# Patient Record
Sex: Female | Born: 1975 | Race: White | Hispanic: No | Marital: Married | Smoking: Never smoker
Health system: Southern US, Community
[De-identification: ages and names within clinical notes are randomized; demographics above are authoritative.]

## PROBLEM LIST (undated history)

## (undated) DIAGNOSIS — H8112 Benign paroxysmal vertigo, left ear: Secondary | ICD-10-CM

## (undated) HISTORY — PX: TONSILLECTOMY: SUR1361

## (undated) HISTORY — PX: TUBAL LIGATION: SHX77

## (undated) HISTORY — DX: Benign paroxysmal vertigo, left ear: H81.12

---

## 2015-02-18 LAB — HM MAMMOGRAPHY

## 2015-12-04 LAB — BASIC METABOLIC PANEL
CREATININE: 0.5 (ref 0.5–1.1)
Glucose: 84
POTASSIUM: 4.8 (ref 3.4–5.3)
Sodium: 139 (ref 137–147)

## 2015-12-04 LAB — CBC AND DIFFERENTIAL
HCT: 39 (ref 36–46)
Hemoglobin: 12.8 (ref 12.0–16.0)
WBC: 6

## 2015-12-04 LAB — TSH: TSH: 1.61 (ref 0.41–5.90)

## 2015-12-04 LAB — VITAMIN D 25 HYDROXY (VIT D DEFICIENCY, FRACTURES): Vit D, 25-Hydroxy: 35

## 2015-12-04 LAB — HM HIV SCREENING LAB: HM HIV Screening: NEGATIVE

## 2015-12-04 LAB — HEPATIC FUNCTION PANEL
ALT: 12 (ref 7–35)
AST: 15 (ref 13–35)

## 2015-12-04 LAB — VITAMIN B12: Vitamin B-12: 1500

## 2015-12-04 LAB — HEMOGLOBIN A1C: HEMOGLOBIN A1C: 5.7

## 2016-06-08 ENCOUNTER — Ambulatory Visit: Payer: Self-pay | Admitting: Physician Assistant

## 2016-06-14 ENCOUNTER — Ambulatory Visit: Payer: Self-pay | Admitting: Osteopathic Medicine

## 2016-06-19 DIAGNOSIS — Z0189 Encounter for other specified special examinations: Secondary | ICD-10-CM

## 2016-07-12 ENCOUNTER — Encounter: Payer: Self-pay | Admitting: Osteopathic Medicine

## 2016-07-12 ENCOUNTER — Ambulatory Visit (INDEPENDENT_AMBULATORY_CARE_PROVIDER_SITE_OTHER): Payer: BLUE CROSS/BLUE SHIELD | Admitting: Osteopathic Medicine

## 2016-07-12 VITALS — BP 135/78 | HR 74 | Ht 63.0 in | Wt 271.0 lb

## 2016-07-12 DIAGNOSIS — Z113 Encounter for screening for infections with a predominantly sexual mode of transmission: Secondary | ICD-10-CM

## 2016-07-12 DIAGNOSIS — M722 Plantar fascial fibromatosis: Secondary | ICD-10-CM | POA: Diagnosis not present

## 2016-07-12 DIAGNOSIS — E039 Hypothyroidism, unspecified: Secondary | ICD-10-CM | POA: Diagnosis not present

## 2016-07-12 DIAGNOSIS — Z8639 Personal history of other endocrine, nutritional and metabolic disease: Secondary | ICD-10-CM

## 2016-07-12 DIAGNOSIS — G8929 Other chronic pain: Secondary | ICD-10-CM

## 2016-07-12 DIAGNOSIS — M545 Low back pain, unspecified: Secondary | ICD-10-CM

## 2016-07-12 DIAGNOSIS — Z87898 Personal history of other specified conditions: Secondary | ICD-10-CM

## 2016-07-12 DIAGNOSIS — Z8742 Personal history of other diseases of the female genital tract: Secondary | ICD-10-CM

## 2016-07-12 MED ORDER — DICLOFENAC SODIUM 1 % TD GEL: 4.0000 g | Freq: Four times a day (QID) | TRANSDERMAL | 5 refills | Status: AC

## 2016-07-12 MED ORDER — MELOXICAM 7.5 MG PO TABS: 7.5000 mg | ORAL_TABLET | Freq: Every day | ORAL | 1 refills | Status: DC

## 2016-07-12 NOTE — Progress Notes (Signed)
HPI: Gabriella Williams is a 41 y.o. female  who [facility] today, 07/12/16,  for chief complaint of:  Chief Complaint  Patient presents with  . Establish Care    Feet - feels like walking on rocks, was told at one point might be plantar fasciits, no home exercises or OTC meds, sleeve on feet wasn't helpful but no other therapies/exercises were tried. Located at antero-medial calcaneus for the most part, occasionally a anterior calcaneus along plantar fascia insertion, bilaterally  Back pain - lower back, aching, sharp pains, stiffness. Tramadol been taking for a year and a half as well as the diclofenac. Previously getting these from family dr. no leg numbness/weakness, no sciatica/radiculopathy symptoms.  Hx thyroid problems: Patient is not sure which dose of levothyroxine she is taking, states it has been maybe over a year since labs were checked  Hx depression/anxiety: Fairly well controlled on current medications, she is following with psychiatry. She is also taking Vyvanse for eating disorder  Hx vitamin D and B12 deficiency: She is taking over-the-counter supplementation  Hx Irritable bowel syndrome - back and forth from constipation to diarrhea. Occasional flares but overall not too bothersome.  Hx abnormal Pap smear: Patient states has been a few years since last Pap which was normal but has had previously abnormal ones. Also would like testing for STDs, had some concerns about ex-boyfriend and possible exposure    Past medical history, surgical history, social history and family history reviewed.  Patient Active Problem List   Diagnosis Date Noted  . Hypothyroidism 07/13/2016  . History of abnormal cervical Pap smear 07/13/2016  . Routine screening for STI (sexually transmitted infection) 07/13/2016  . Chronic bilateral low back pain without sciatica 07/13/2016  . Plantar fasciitis, bilateral 07/13/2016  . History of vitamin D  deficiency 07/13/2016  . History of non anemic vitamin B12 deficiency 07/13/2016    Current medication list and allergy/intolerance information reviewed.   No current outpatient prescriptions on file prior to visit.   No current facility-administered medications on file prior to visit.    Allergies  Allergen Reactions  . Clindamycin/Lincomycin     CHEST PAIN  . Penicillins Hives    HIVES      Review of Systems:  Constitutional: No recent illness  HEENT: No  headache, no vision change  Cardiac: No  chest pain, No  pressure, No palpitations  Respiratory:  No  shortness of breath. No  Cough  Gastrointestinal: No  abdominal pain, no change on bowel habits  Musculoskeletal: + myalgia/arthralgia  Skin: No  Rash  Hem/Onc: No  easy bruising/bleeding, No  abnormal lumps/bumps  Neurologic: No  weakness, No  Dizziness  Psychiatric: +concerns with depression, +concerns with anxiety  Exam:  BP 135/78   Pulse 74   Ht 5\' 3"  (1.6 m)   Wt 271 lb (122.9 kg)   BMI 48.01 kg/m   Constitutional: VS see above. General Appearance: alert, well-developed, well-nourished, NAD  Eyes: Normal lids and conjunctive, non-icteric sclera  Ears, Nose, Mouth, Throat: MMM, Normal external inspection ears/nares/mouth/lips/gums.  Neck: No masses, trachea midline.   Respiratory: Normal respiratory effort. no wheeze, no rhonchi, no rales  Cardiovascular: S1/S2 normal, no murmur, no rub/gallop auscultated. RRR.   Musculoskeletal: Gait normal. Symmetric and independent movement of all extremities. Negative straight leg raise test. Positive Perry lumbar muscle tenderness lumbar spine bilaterally, normal hip alignment, normal flexion/extension hip. Tenderness to palpation at insertion of plantar fascia and at medial  calcaneal region anteriorly. No erythema/effusion/ecchymosis  Neurological: Normal balance/coordination. No tremor.  Skin: warm, dry, intact.   Psychiatric: Normal judgment/insight.  Normal mood and affect. Oriented x3.      ASSESSMENT/PLAN:   Hypothyroidism, unspecified type - Plan: CBC, Lipid panel, COMPLETE METABOLIC PANEL WITH GFR, TSH  History of abnormal cervical Pap smear - Return to clinic for pap with annual  Routine screening for STI (sexually transmitted infection) - Plan: Hepatitis C antibody, reflex, HIV antibody, RPR, Hepatitis B surface antigen, Hepatitis B core antibody, total, GC/chlamydia probe amp, urine  Chronic bilateral low back pain without sciatica - Trial home exercises, would avoid daily use of tramadol, transition to alternative NSAID and add topical - Plan: meloxicam (MOBIC) 7.5 MG tablet, diclofenac sodium (VOLTAREN) 1 % GEL  Plantar fasciitis, bilateral - Trial home exercises, would avoid daily use of tramadol, transition to alternative NSAID and add topical - Plan: meloxicam (MOBIC) 7.5 MG tablet, diclofenac sodium (VOLTAREN) 1 % GEL  History of vitamin D deficiency - Plan: VITAMIN D 25 Hydroxy (Vit-D Deficiency, Fractures)  History of non anemic vitamin B12 deficiency - Plan: Vitamin B12    Patient Instructions  Plan:  Need to know medications - thyroid dose, psychiatric medications  Stop Diclofenac oral medication and try Meloxicam and Diclofenac gel  Need records from psychiatry office and most recent OBGYN as well as previous PCP - please fill out records release forms for these offices   Need to return for labs and Pap smear - can get labs done ahead of your visit if desired and we can discuss results in detail when I see you for your Pap/Annual.   Any questions/concerns, please let us know!     Follow-up plan: Return for PAP AND ANNUAL PHYSICAL as directed .  Visit summary with medication list and pertinent instructions was printed for patient to review, alert [place] if any changes needed. All questions at time of visit were answered - patient instructed to contact office with any additional concerns. ER/RTC precautions were  reviewed with the patient and understanding verbalized.

## 2016-07-12 NOTE — Patient Instructions (Addendum)
Plan:  Need to know medications - thyroid dose, psychiatric medications  Stop Diclofenac oral medication and try Meloxicam and Diclofenac gel  Need records from psychiatry office and most recent OBGYN as well as previous PCP - please fill out records release forms for these offices   Need to return for labs and Pap smear - can get labs done ahead of your visit if desired and we can discuss results in detail when I see you for your Pap/Annual.   Any questions/concerns, please let us know!

## 2016-07-13 DIAGNOSIS — M545 Low back pain: Secondary | ICD-10-CM

## 2016-07-13 DIAGNOSIS — Z8742 Personal history of other diseases of the female genital tract: Secondary | ICD-10-CM | POA: Insufficient documentation

## 2016-07-13 DIAGNOSIS — E039 Hypothyroidism, unspecified: Secondary | ICD-10-CM | POA: Insufficient documentation

## 2016-07-13 DIAGNOSIS — Z113 Encounter for screening for infections with a predominantly sexual mode of transmission: Secondary | ICD-10-CM | POA: Insufficient documentation

## 2016-07-13 DIAGNOSIS — G8929 Other chronic pain: Secondary | ICD-10-CM | POA: Insufficient documentation

## 2016-07-13 DIAGNOSIS — Z8639 Personal history of other endocrine, nutritional and metabolic disease: Secondary | ICD-10-CM | POA: Insufficient documentation

## 2016-07-13 DIAGNOSIS — M722 Plantar fascial fibromatosis: Secondary | ICD-10-CM | POA: Insufficient documentation

## 2016-07-20 ENCOUNTER — Telehealth: Payer: Self-pay | Admitting: Osteopathic Medicine

## 2016-07-20 ENCOUNTER — Encounter: Payer: Self-pay | Admitting: Osteopathic Medicine

## 2016-07-20 DIAGNOSIS — N736 Female pelvic peritoneal adhesions (postinfective): Secondary | ICD-10-CM | POA: Insufficient documentation

## 2016-07-20 NOTE — Telephone Encounter (Signed)
Pt called clinic to see if PCP would take over writing her Rx for Vyvanse 40mg . Pt takes one tab daily to help with her eating disorder (would not specify which disorder). [facility], but she has been discharged due to missed appointments. Pt states the appointments she missed she was not aware of. Will route to PCP for review.

## 2016-07-22 NOTE — Telephone Encounter (Signed)
Left detailed message on patient vm advising that Vyanse cannot be refill without [facility]. Rhonda Cunningham,CMA

## 2016-07-22 NOTE — Telephone Encounter (Signed)
I don't have Vyvanse on her list. Will need records form [facility] first before taking over prescription of this controlled substance

## 2016-07-25 ENCOUNTER — Ambulatory Visit (INDEPENDENT_AMBULATORY_CARE_PROVIDER_SITE_OTHER): Payer: BLUE CROSS/BLUE SHIELD | Admitting: Osteopathic Medicine

## 2016-07-25 ENCOUNTER — Encounter: Payer: Self-pay | Admitting: Osteopathic Medicine

## 2016-07-25 ENCOUNTER — Other Ambulatory Visit (HOSPITAL_COMMUNITY)
Admission: RE | Admit: 2016-07-25 | Discharge: 2016-07-25 | Disposition: A | Discharge status: Home / Self Care | Payer: BLUE CROSS/BLUE SHIELD | Source: Ambulatory Visit | Attending: Osteopathic Medicine | Admitting: Osteopathic Medicine

## 2016-07-25 VITALS — BP 138/85 | HR 80 | Ht 63.0 in | Wt 272.0 lb

## 2016-07-25 DIAGNOSIS — Z0000 Encounter for general adult medical examination without abnormal findings: Secondary | ICD-10-CM | POA: Diagnosis not present

## 2016-07-25 DIAGNOSIS — Z8669 Personal history of other diseases of the nervous system and sense organs: Secondary | ICD-10-CM

## 2016-07-25 NOTE — Progress Notes (Signed)
HPI: Gabriella LeavellChristy Anne Williams is a 41 y.o. female  who [facility] today, 07/25/16,  for chief complaint of:  Chief Complaint  Patient presents with  . Annual Exam  . Gynecologic Exam    Hasn't gotten labs yet.  See telephone note re: Vyvanse for "always feeling hungry." Need records from psych, she states it's hard to get an appointment there.    Hx sleep apnea: thinks due for sleep study, never on CPAP d/t cost w/o insurance    Past medical, surgical, social and family history reviewed: Patient Active Problem List   Diagnosis Date Noted  . 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 Hypothyroidism 07/13/2016  . History of abnormal cervical Pap smear 07/13/2016  . Routine screening for STI (sexually transmitted infection) 07/13/2016  . Chronic bilateral low back pain without sciatica 07/13/2016  . Plantar fasciitis, bilateral 07/13/2016  . History of vitamin D deficiency 07/13/2016  . History of non anemic vitamin B12 deficiency 07/13/2016   No past surgical history on file.   Social History  Substance Use Topics  . Smoking status: Never Smoker  . Smokeless tobacco: Never Used  . Alcohol use Not on file   No family history on file.   Current medication list and allergy/intolerance information reviewed:   Current Outpatient Prescriptions  Medication Sig Dispense Refill  . diclofenac (VOLTAREN) 75 MG EC tablet Take 75 mg by mouth 2 (two) times daily.    . diclofenac sodium (VOLTAREN) 1 % GEL Apply 4 g topically 4 (four) times daily. As needed for pain, apply to affected joint/back. 100 g 5  . escitalopram (LEXAPRO) 20 MG tablet Take 20 mg by mouth daily.    Marland Kitchen. levothyroxine (SYNTHROID, LEVOTHROID) 50 MCG tablet Take 50 mcg by mouth daily before breakfast.    . meloxicam (MOBIC) 7.5 MG tablet Take 1-2 tablets (7.5-15 mg total) by mouth daily. As needed for pain 90 tablet 1  . traMADol (ULTRAM) 50 MG tablet Take by mouth every 6 (six) hours as  needed.     No current facility-administered medications for this visit.    Allergies  Allergen Reactions  . Clindamycin/Lincomycin     CHEST PAIN  . Penicillins Hives    HIVES      Review of Systems:  Constitutional:  No  fever, no chills, No recent illness  HEENT: No  headache, no vision change  Cardiac: No  chest pain, No  pressure, No palpitations  Respiratory:  No  shortness of breath. No  Cough  Musculoskeletal: No new myalgia/arthralgia  Genitourinary: No  incontinence, No  abnormal genital bleeding, No abnormal genital discharge  Skin: No  Rash, No other wounds/concerning lesions  Psychiatric: No  concerns with depression, No  concerns with anxiety, No sleep problems, No mood problems  Exam:  BP 138/85   Pulse 80   Ht 5\' 3"  (1.6 m)   Wt 272 lb (123.4 kg)   BMI 48.18 kg/m   Constitutional: VS see above. General Appearance: alert, well-developed, well-nourished, NAD  Eyes: Normal lids and conjunctive, non-icteric sclera  Ears, Nose, Mouth, Throat: MMM, Normal external inspection ears/nares/mouth/lips/gums.  Neck: No masses, trachea midline. No thyroid enlargement. No tenderness/mass appreciated. No lymphadenopathy  Respiratory: Normal respiratory effort. no wheeze, no rhonchi, no rales  Cardiovascular: S1/S2 normal, no murmur, no rub/gallop auscultated. RRR. No lower extremity edema. P  Gastrointestinal: Nontender, no masses.   Musculoskeletal: Gait normal. No clubbing/cyanosis of digits.   Neurological: Normal balance/coordination. No tremor.  Skin: warm, dry, intact. No rash/ulcer. No concerning nevi or subq nodules on limited exam.    Psychiatric: Normal judgment/insight. Normal mood and affect. Oriented x3.  GYN: No lesions/ulcers to external genitalia, normal urethra, normal vaginal mucosa, physiologic discharge, cervix normal without lesions, uterus not enlarged or tender, adnexa no masses and nontender  BREAST: No rashes/skin changes, normal  fibrous breast tissue, no masses or tenderness, normal nipple without discharge, normal axilla     ASSESSMENT/PLAN:   Annual physical exam - Plan: Cytology - PAP  History of obstructive sleep apnea - Plan: Home sleep test   Patient Instructions  Plan:  Need labs done, please get blood drawn at your earliest convenience!   You should hear back about your Pap results within one week, please call us if you haven't heard.   We need records form [facility] - please complete the records release form for [place] so we can request these.   We need to probably repeat the sleep study to see if you need a breathing machine for the apnea - you should get a call about setting this up as well.   If all is looking good, we can touch base about a follow-up plan once I've reviewed the records form [facility]. Otherwise, plan to repeat annual physical in one year and see [place] as needed sooner that that!     FEMALE PREVENTIVE CARE Updated 07/25/16   ANNUAL SCREENING/COUNSELING  Diet/Exercise - HEALTHY HABITS DISCUSSED TO DECREASE CV RISK History  Smoking Status  . Never Smoker  Smokeless Tobacco  . Never Used    History  Alcohol use Not on file    Depression screen [facility] 2/9 07/13/2016  Decreased Interest 0  Down, Depressed, Hopeless 0  PHQ - 2 Score 0  Altered sleeping 0  Tired, decreased energy 0  Change in appetite 0  Feeling bad or failure about yourself  0  Trouble concentrating 0  Moving slowly or fidgety/restless 0  Suicidal thoughts 0  PHQ-9 Score 0     Domestic violence concerns - no  HTN SCREENING - SEE VITALS  SEXUAL HEALTH  Sexually active in the past year - Yes with female.  Need/want STI testing today? - yes, orders in   Plans for pregnancy? - s/p BTL  INFECTIOUS DISEASE SCREENING  HIV - needs  GC/CT - needs  HepC - DOB [birth year] - does not need  TB - does not need  DISEASE SCREENING  Lipid - needs  DM2 - needs  Osteoporosis - women age 28+ - does not  need  CANCER SCREENING  Cervical - needs  Breast - needs  Lung - does not need  Colon - does not need  ADULT VACCINATION  Influenza - annual vaccine recommended  Td - booster every 10 years - think s has gotten this in past 10 years   Zoster - option at 73, yes at 60+   PCV13 - was not indicated  PPSV23 - was not indicated  There is no immunization history on file for this patient.     Visit summary with medication list and pertinent instructions was printed for patient to review. All questions at time of visit were answered - patient instructed to contact office with any additional concerns. ER/RTC precautions were reviewed with the patient. Follow-up plan: Return for recheck depending on results, 1 year for annual physical .

## 2016-07-25 NOTE — Patient Instructions (Addendum)
Plan:  Need labs done, please get blood drawn at your earliest convenience!   You should hear back about your Pap results within one week, please call us if you haven't heard.   We need records form [facility] - please complete the records release form for us so we can request these.   We need to probably repeat the sleep study to see if you need a breathing machine for the apnea - you should get a call about setting this up as well.   If all is looking good, we can touch base about a follow-up plan once I've reviewed the records form [facility]. Otherwise, plan to repeat annual physical in one year and see us as needed sooner that that!

## 2016-07-25 NOTE — Telephone Encounter (Addendum)
Review[facility], most recent med list does not mention Vyvanse. [place] database reviewed (there are two different records one for Judyann Munsonhristy Anne and one for Christyanne) has [place] from [place] Bra[place] psychiatrist Rx Vyvanse 40 mg filled 03/03/16, 04/28/16 and 06/03/16 these are the only ones on records since 01/2015.

## 2016-07-28 LAB — CYTOLOGY - PAP
CHLAMYDIA, DNA PROBE: NEGATIVE
DIAGNOSIS: NEGATIVE
HPV (WINDOPATH): NOT DETECTED
NEISSERIA GONORRHEA: NEGATIVE
TRICH (WINDOWPATH): NEGATIVE

## 2016-07-29 ENCOUNTER — Encounter: Payer: Self-pay | Admitting: Osteopathic Medicine

## 2016-08-10 ENCOUNTER — Telehealth: Payer: Self-pay | Admitting: Osteopathic Medicine

## 2016-08-10 NOTE — Telephone Encounter (Signed)
Patient called request to know if Dr. Lyn HollingsheadAlexander has received a [facility] the doctor's name is Dr. Tawni CarnesMadonna it is information about her meds Vyvanse and Celexea so Dr. Lyn HollingsheadAlexander can eventually take over and start prescribing her med. I adv will send a message and call back once Dr. Lyn HollingsheadAlexander reply. Thanks

## 2016-08-10 NOTE — Telephone Encounter (Signed)
We have the records release signed and on file but I have not received records. See scanned documents, can try sending it again through fax.

## 2016-08-16 ENCOUNTER — Telehealth: Payer: Self-pay | Admitting: Osteopathic Medicine

## 2016-08-16 DIAGNOSIS — Z1239 Encounter for other screening for malignant neoplasm of breast: Secondary | ICD-10-CM

## 2016-08-16 NOTE — Telephone Encounter (Signed)
Patient called today asking about her order for her home sleep study and Mammogram she stated she had not heard anything. I printed the sleep study and will send it to [facility] they will call and schedule with patient. I did not see an order for a mammogram if you could please place this. - CF

## 2016-08-16 NOTE — Telephone Encounter (Signed)
Order for Mammo is in

## 2016-09-13 ENCOUNTER — Encounter: Payer: Self-pay | Admitting: Osteopathic Medicine

## 2016-09-13 ENCOUNTER — Ambulatory Visit (INDEPENDENT_AMBULATORY_CARE_PROVIDER_SITE_OTHER): Payer: BLUE CROSS/BLUE SHIELD

## 2016-09-13 ENCOUNTER — Other Ambulatory Visit: Payer: Self-pay | Admitting: Osteopathic Medicine

## 2016-09-13 ENCOUNTER — Ambulatory Visit (INDEPENDENT_AMBULATORY_CARE_PROVIDER_SITE_OTHER): Payer: BLUE CROSS/BLUE SHIELD | Admitting: Osteopathic Medicine

## 2016-09-13 VITALS — BP 130/84 | HR 80 | Ht 63.0 in | Wt 267.0 lb

## 2016-09-13 DIAGNOSIS — H8112 Benign paroxysmal vertigo, left ear: Secondary | ICD-10-CM | POA: Diagnosis not present

## 2016-09-13 DIAGNOSIS — Z1231 Encounter for screening mammogram for malignant neoplasm of breast: Secondary | ICD-10-CM

## 2016-09-13 HISTORY — DX: Benign paroxysmal vertigo, left ear: H81.12

## 2016-09-13 LAB — CBC
HEMATOCRIT: 37.3 % (ref 35.0–45.0)
Hemoglobin: 12.7 g/dL (ref 11.7–15.5)
MCH: 30.5 pg (ref 27.0–33.0)
MCHC: 34 g/dL (ref 32.0–36.0)
MCV: 89.7 fL (ref 80.0–100.0)
MPV: 9.8 fL (ref 7.5–12.5)
PLATELETS: 278 10*3/uL (ref 140–400)
RBC: 4.16 MIL/uL (ref 3.80–5.10)
RDW: 13.3 % (ref 11.0–15.0)
WBC: 5.6 10*3/uL (ref 3.8–10.8)

## 2016-09-13 MED ORDER — DIAZEPAM 2 MG PO TABS: 2.0000 mg | ORAL_TABLET | Freq: Two times a day (BID) | ORAL | 0 refills | Status: DC | PRN

## 2016-09-13 MED ORDER — MECLIZINE HCL 25 MG PO TABS: 25.0000 mg | ORAL_TABLET | Freq: Three times a day (TID) | ORAL | 0 refills | Status: AC | PRN

## 2016-09-13 NOTE — Progress Notes (Signed)
HPI: Gabriella Williams is a 41 y.o. female  who [facility] to [facility] today, 09/13/16,  for chief complaint of:  Chief Complaint  Patient presents with  . Dizziness  . Headache    Still hasn't gotten labs done. These were ordered >2 months ago. Concerned about STD screening. Labs reprinted for her.   Concerns about vertigo: She has a history of vertigo symptoms off and on for years but this time concerned d/t headache. She is having some tightness feeling at the back of the head, occasionally going up into the frontal area, typically bilateral. Has been going on for the past few days or so. No hearing change, no vision change, no loss of consciousness, no fall. No focal weakness. Dizziness is exacerbated by movement, particularly toward the left, rising from lying down or seated position, lying down on her left side.   Past medical history, surgical history, social history and family history reviewed.  Patient Active Problem List   Diagnosis Date Noted  . 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 Hypothyroidism 07/13/2016  . History of abnormal cervical Pap smear 07/13/2016  . Routine screening for STI (sexually transmitted infection) 07/13/2016  . Chronic bilateral low back pain without sciatica 07/13/2016  . Plantar fasciitis, bilateral 07/13/2016  . History of vitamin D deficiency 07/13/2016  . History of non anemic vitamin B12 deficiency 07/13/2016    Current medication list and allergy/intolerance information reviewed.   Current Outpatient Prescriptions on File Prior to Visit  Medication Sig Dispense Refill  . diclofenac (VOLTAREN) 75 MG EC tablet Take 75 mg by mouth 2 (two) times daily.    . diclofenac sodium (VOLTAREN) 1 % GEL Apply 4 g topically 4 (four) times daily. As needed for pain, apply to affected joint/back. 100 g 5  . escitalopram (LEXAPRO) 20 MG tablet Take 20 mg by mouth daily.    Marland Kitchen. levothyroxine (SYNTHROID, LEVOTHROID) 50 MCG tablet  Take 50 mcg by mouth daily before breakfast.    . meloxicam (MOBIC) 7.5 MG tablet Take 1-2 tablets (7.5-15 mg total) by mouth daily. As needed for pain 90 tablet 1  . traMADol (ULTRAM) 50 MG tablet Take by mouth every 6 (six) hours as needed.     No current facility-administered medications on file prior to visit.    Allergies  Allergen Reactions  . Clindamycin/Lincomycin     CHEST PAIN  . Penicillins Hives    HIVES      Review of Systems:  Constitutional: No recent illness  HEENT: +headache, no vision change  Cardiac: No  chest pain, No  pressure, No palpitations  Respiratory:  No  shortness of breath. No  Cough  Gastrointestinal: No  abdominal pain  Musculoskeletal: No new myalgia/arthralgia  Skin: No  Rash  Hem/Onc: No  easy bruising/bleeding  Neurologic: No  weakness, +Dizziness  Psychiatric: No  concerns with depression, No  concerns with anxiety  Exam:  BP 130/84   Pulse 80   Ht 5\' 3"  (1.6 m)   Wt 267 lb (121.1 kg)   BMI 47.30 kg/m   Constitutional: VS see above. General Appearance: alert, well-developed, well-nourished, NAD  Eyes: Normal lids and conjunctive, non-icteric sclera  Ears, Nose, Mouth, Throat: MMM, Normal external inspection ears/nares/mouth/lips/gums.  Neck: No masses, trachea midline.   Respiratory: Normal respiratory effort. no wheeze, no rhonchi, no rales  Cardiovascular: S1/S2 normal, no murmur, no rub/gallop auscultated. RRR.   Musculoskeletal: Gait normal. Symmetric and independent movement of all extremities  Neurological:  Normal balance/coordination. No tremor. EOMI without nystagmus, PERRLA. Strength 5 out of 5 in all 4 extremities, sensation intact and symmetric bilaterally. Cranial nerves intact. Modified Dix-Hallpike maneuver strongly positive to the left side with nystagmus, mildly positive to the right side without nystagmus.  Skin: warm, dry, intact.   Psychiatric: Normal judgment/insight. Normal mood and affect.  Oriented x3.      ASSESSMENT/PLAN:   Symptoms and exam most likely consistent with BPPV with tension headache, no alarm symptoms for needing urgent imaging at this point but precautions reviewed with patient and if worse/no improvement will consider MRI/neurology referral based on how she's doing at that point  Home instructions given for modified Epley maneuver  BPPV (benign paroxysmal positional vertigo), left - Plan: meclizine (ANTIVERT) 25 MG tablet, diazepam (VALIUM) 2 MG tablet    Follow-up plan: Return if symptoms worsen or fail to improve in 1-2 weeks.  Visit summary with medication list and pertinent instructions was printed for patient to review, alert [place] if any changes needed. All questions at time of visit were answered - patient instructed to contact office with any additional concerns. ER/RTC precautions were reviewed with the patient and understanding verbalized.

## 2016-09-13 NOTE — Patient Instructions (Signed)

## 2016-09-14 LAB — HEPATITIS B CORE ANTIBODY, TOTAL: HEP B C TOTAL AB: NONREACTIVE

## 2016-09-14 LAB — LIPID PANEL
CHOLESTEROL: 191 mg/dL (ref <200)
HDL: 38 mg/dL — AB (ref >50)
LDL Cholesterol: 125 mg/dL — ABNORMAL HIGH (ref <100)
TRIGLYCERIDES: 142 mg/dL (ref <150)
Total CHOL/HDL Ratio: 5 Ratio — ABNORMAL HIGH (ref <5.0)
VLDL: 28 mg/dL (ref <30)

## 2016-09-14 LAB — COMPLETE METABOLIC PANEL WITH GFR
ALBUMIN: 4.4 g/dL (ref 3.6–5.1)
ALK PHOS: 65 U/L (ref 33–115)
ALT: 13 U/L (ref 6–29)
AST: 14 U/L (ref 10–30)
BILIRUBIN TOTAL: 0.4 mg/dL (ref 0.2–1.2)
BUN: 10 mg/dL (ref 7–25)
CALCIUM: 9.3 mg/dL (ref 8.6–10.2)
CO2: 25 mmol/L (ref 20–31)
CREATININE: 0.58 mg/dL (ref 0.50–1.10)
Chloride: 103 mmol/L (ref 98–110)
GFR, Est African American: >89 mL/min (ref >=60)
Glucose, Bld: 106 mg/dL — ABNORMAL HIGH (ref 65–99)
Potassium: 4.1 mmol/L (ref 3.5–5.3)
Sodium: 138 mmol/L (ref 135–146)
TOTAL PROTEIN: 6.9 g/dL (ref 6.1–8.1)

## 2016-09-14 LAB — GC/CHLAMYDIA PROBE AMP
CT PROBE, AMP APTIMA: NOT DETECTED
GC PROBE AMP APTIMA: NOT DETECTED

## 2016-09-14 LAB — RPR

## 2016-09-14 LAB — HEPATITIS C ANTIBODY: HCV AB: NEGATIVE

## 2016-09-14 LAB — HEPATITIS B SURFACE ANTIGEN: Hepatitis B Surface Ag: NEGATIVE

## 2016-09-14 LAB — HIV ANTIBODY (ROUTINE TESTING W REFLEX): HIV: NONREACTIVE

## 2016-09-14 LAB — VITAMIN D 25 HYDROXY (VIT D DEFICIENCY, FRACTURES): VIT D 25 HYDROXY: 29 ng/mL — AB (ref 30–100)

## 2016-09-14 LAB — TSH: TSH: 2.84 m[IU]/L

## 2016-09-15 LAB — HEMOGLOBIN A1C
HEMOGLOBIN A1C: 5.3 % (ref <5.7)
MEAN PLASMA GLUCOSE: 105 mg/dL

## 2016-09-16 ENCOUNTER — Other Ambulatory Visit: Payer: Self-pay | Admitting: Osteopathic Medicine

## 2016-09-16 LAB — HEPATITIS C AB W/RFL RNA, PCR + GENO
HEP C AB: NONREACTIVE
Signal to Cutoff: 0.01 ratio (ref <1.00)

## 2016-09-16 MED ORDER — VITAMIN D (ERGOCALCIFEROL) 1.25 MG (50000 UNIT) PO CAPS: 50000.0000 [IU] | ORAL_CAPSULE | ORAL | 0 refills | Status: DC

## 2016-09-16 NOTE — Progress Notes (Signed)
Sent high-dose supplementation vitamin D

## 2016-10-27 ENCOUNTER — Ambulatory Visit: Payer: BLUE CROSS/BLUE SHIELD | Admitting: Physician Assistant

## 2016-10-27 DIAGNOSIS — Z0189 Encounter for other specified special examinations: Secondary | ICD-10-CM

## 2016-11-08 ENCOUNTER — Telehealth: Payer: Self-pay | Admitting: Osteopathic Medicine

## 2016-11-08 NOTE — Telephone Encounter (Signed)
Pt called and wants to know if you have received her records from [facility] so that you can go ahead and start witting her Vivance prescription. She states she is completely out of the med. Thanks

## 2016-11-11 NOTE — Telephone Encounter (Signed)
Have not received records. She is taking the Vyvanse for appetite suppression.   Would recommend she contact [facility] again, looks like we have a records release on file.   Would have her plan to follow-up with me in the next few weeks to discuss options for continuation of this medication - either way, before I take over the prescription we would need a controlled substance agreement on file in the office for her so we would discuss that at her visit.

## 2016-11-15 ENCOUNTER — Other Ambulatory Visit: Payer: Self-pay | Admitting: Osteopathic Medicine

## 2016-11-15 NOTE — Telephone Encounter (Signed)
Pt called. She's following up on request for refill on faxed from her pharmacy last week.  Thank you.

## 2016-11-16 NOTE — Telephone Encounter (Signed)
Without controlled substance agreement on file for chronic management, will not refill controlled substances without office visit

## 2016-11-16 NOTE — Telephone Encounter (Signed)
Please see note below. Rhonda Cunningham,CMA  

## 2016-11-16 NOTE — Telephone Encounter (Signed)
Ananda wants a refill on Tramadol. Historical provider.

## 2016-11-17 NOTE — Telephone Encounter (Signed)
pATIENT HAS BEEN INFORMED. Rhonda Cunningham,CMA  

## 2016-11-18 ENCOUNTER — Encounter: Payer: Self-pay | Admitting: Osteopathic Medicine

## 2016-11-18 ENCOUNTER — Ambulatory Visit (INDEPENDENT_AMBULATORY_CARE_PROVIDER_SITE_OTHER): Payer: BLUE CROSS/BLUE SHIELD | Admitting: Osteopathic Medicine

## 2016-11-18 VITALS — BP 125/84 | HR 94 | Ht 63.0 in | Wt 264.0 lb

## 2016-11-18 DIAGNOSIS — K068 Other specified disorders of gingiva and edentulous alveolar ridge: Secondary | ICD-10-CM

## 2016-11-18 DIAGNOSIS — K0500 Acute gingivitis, plaque induced: Secondary | ICD-10-CM | POA: Diagnosis not present

## 2016-11-18 DIAGNOSIS — M545 Low back pain, unspecified: Secondary | ICD-10-CM

## 2016-11-18 DIAGNOSIS — Z0289 Encounter for other administrative examinations: Secondary | ICD-10-CM | POA: Diagnosis not present

## 2016-11-18 DIAGNOSIS — F50819 Binge eating disorder, unspecified: Secondary | ICD-10-CM

## 2016-11-18 DIAGNOSIS — G8929 Other chronic pain: Secondary | ICD-10-CM

## 2016-11-18 DIAGNOSIS — F5081 Binge eating disorder: Secondary | ICD-10-CM

## 2016-11-18 DIAGNOSIS — E559 Vitamin D deficiency, unspecified: Secondary | ICD-10-CM

## 2016-11-18 MED ORDER — LISDEXAMFETAMINE DIMESYLATE 50 MG PO CAPS: 50.0000 mg | ORAL_CAPSULE | Freq: Every day | ORAL | 0 refills | Status: DC

## 2016-11-18 MED ORDER — CHLORHEXIDINE GLUCONATE 0.12 % MT SOLN: 15.0000 mL | Freq: Two times a day (BID) | OROMUCOSAL | 0 refills | Status: AC

## 2016-11-18 MED ORDER — TRAMADOL HCL 50 MG PO TABS: 50.0000 mg | ORAL_TABLET | Freq: Two times a day (BID) | ORAL | 0 refills | Status: DC | PRN

## 2016-11-18 NOTE — Progress Notes (Signed)
HPI: Gabriella Williams is a 41 y.o. female  who presents to [facility] today, 11/18/16,  for chief complaint of:  Chief Complaint  Patient presents with  . Other    DISCUSS TRAMADOL AND VYANSE REFILLS    Binge eating disorder - Has been on Vyvanse for many years but has great difficulty getting an appointment with her psychiatrist in [place]. We have made multiple attempts to try to obtain records from this practice and to no avail. She is also taking Lexapro for anxiety/depression, overall doing well on both of these medications.  Low back pain and foot pain: previously on Tramadol, off for about a week, she tried taking Mobic alone but this is not helping as much. She typically takes one tramadol per day  New issue today: Pain in the gum behind last molar on the left lower jaw. Ongoing for a few days. No drainage. No real dental pain, gum is just irritated.   Past medical, surgical, social and family history reviewed: Patient Active Problem List   Diagnosis Date Noted  . BPPV (benign paroxysmal positional vertigo), left 09/13/2016  . 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 Hypothyroidism 07/13/2016  . History of abnormal cervical Pap smear 07/13/2016  . Routine screening for STI (sexually transmitted infection) 07/13/2016  . Chronic bilateral low back pain without sciatica 07/13/2016  . Plantar fasciitis, bilateral 07/13/2016  . History of vitamin D deficiency 07/13/2016  . History of non anemic vitamin B12 deficiency 07/13/2016   Past Surgical History:  Procedure Laterality Date  . CESAREAN SECTION    . TONSILLECTOMY    . TUBAL LIGATION     Social History  Substance Use Topics  . Smoking status: Never Smoker  . Smokeless tobacco: Never Used  . Alcohol use Not on file   Family History  Problem Relation Age of Onset  . Diabetes Mother   . Hyperlipidemia Mother   . Cancer Mother   . Breast cancer Mother   . Diabetes Father   . Cancer  Father      Current medication list and allergy/intolerance information reviewed:   Current Outpatient Prescriptions  Medication Sig Dispense Refill  . diclofenac (VOLTAREN) 75 MG EC tablet Take 75 mg by mouth 2 (two) times daily.    . diclofenac sodium (VOLTAREN) 1 % GEL Apply 4 g topically 4 (four) times daily. As needed for pain, apply to affected joint/back. 100 g 5  . escitalopram (LEXAPRO) 20 MG tablet Take 20 mg by mouth daily.    Marland Kitchen levothyroxine (SYNTHROID, LEVOTHROID) 50 MCG tablet Take 50 mcg by mouth daily before breakfast.    . lisdexamfetamine (VYVANSE) 50 MG capsule Take 50 mg by mouth daily.    . meclizine (ANTIVERT) 25 MG tablet Take 1 tablet (25 mg total) by mouth 3 (three) times daily as needed for dizziness. 30 tablet 0  . meloxicam (MOBIC) 7.5 MG tablet Take 1-2 tablets (7.5-15 mg total) by mouth daily. As needed for pain 90 tablet 1  . traMADol (ULTRAM) 50 MG tablet Take by mouth every 6 (six) hours as needed.    . Vitamin D, Ergocalciferol, (DRISDOL) 50000 units CAPS capsule Take 1 capsule (50,000 Units total) by mouth every 14 (fourteen) days. Take for 8 total doses (16 weeks) 8 capsule 0   No current facility-administered medications for this visit.    Allergies  Allergen Reactions  . Clindamycin/Lincomycin     CHEST PAIN  . Penicillins Hives    HIVES  Review of Systems:  Constitutional:  No  fever, no chills, No recent illness, No unintentional weight changes. No significant fatigue.   HEENT: No  headache, no vision change  Cardiac: No  chest pain, No  pressure, No palpitations,  Respiratory:  No  shortness of breath. No  Cough  Gastrointestinal: No  abdominal pain, No  nausea  Musculoskeletal: No new myalgia/arthralgia    Neurologic: No  weakness, No  dizziness  Psychiatric: No  concerns with depression, No  concerns with anxiety, No sleep problems, No mood problems  Exam:  BP 125/84   Pulse 94   Ht  (1.6 m)   Wt 264 lb (119.7 kg)    BMI 46.77 kg/m   Constitutional: VS see above. General Appearance: alert, well-developed, well-nourished, NAD  Eyes: Normal lids and conjunctive, non-icteric sclera  Ears, Nose, Mouth, Throat: MMM, Normal external inspection ears/nares/mouth/lips. Some erythema to gums behind last lower left molar, no drainage, no abscess, no significant caries noted in that area  Neck: No masses, trachea midline.   Respiratory: Normal respiratory effort. no wheeze, no rhonchi, no rales  Cardiovascular: S1/S2 normal, no murmur, no rub/gallop auscultated. RRR. No lower extremity edema.   Musculoskeletal: Gait normal. No clubbing/cyanosis of digits.   Neurological: Normal balance/coordination. No tremor.    Psychiatric: Normal judgment/insight. Normal mood and affect. Oriented x3.     ASSESSMENT/PLAN:   Chronic bilateral low back pain without sciatica - Tramadol No. 90 for 90 days prescription and provided to the patient 11/18/2016 - Plan: traMADol (ULTRAM) 50 MG tablet  Vitamin D deficiency - Plan: VITAMIN D 25 Hydroxy (Vit-D Deficiency, Fractures)  Binge eating disorder - Three-month supply of postdated prescriptions for Vyvanse provided to the patient 11/18/2016 - Plan: lisdexamfetamine (VYVANSE) 50 MG capsule  Pain medication agreement completed  Pain in gums - Looks like gingivitis, if persist/worsen please see dentist - Plan: chlorhexidine (PERIDEX) 0.12 % solution  Gingivitis, acute - Plan: chlorhexidine (PERIDEX) 0.12 % solution      Visit summary with medication list and pertinent instructions was printed for patient to review. All questions at time of visit were answered - patient instructed to contact office with any additional concerns. ER/RTC precautions were reviewed with the patient. Follow-up plan: Return in about 4 months (around 03/12/2017) for refills on controlled substances, sooner if needed .  Note: Total time spent 25 minutes, greater than 50% of the visit was spent  face-to-face counseling and coordinating care for the following: The primary encounter diagnosis was Chronic bilateral low back pain without sciatica. Diagnoses of Vitamin D deficiency, Binge eating disorder, Pain medication agreement completed, Pain in gums, and Gingivitis, acute were also pertinent to this visit.Marland Kitchen

## 2016-11-19 LAB — VITAMIN D 25 HYDROXY (VIT D DEFICIENCY, FRACTURES): VIT D 25 HYDROXY: 26 ng/mL — AB (ref 30–100)

## 2016-11-22 MED ORDER — VITAMIN D (ERGOCALCIFEROL) 1.25 MG (50000 UNIT) PO CAPS: 50000.0000 [IU] | ORAL_CAPSULE | ORAL | 0 refills | Status: DC

## 2016-11-22 NOTE — Addendum Note (Signed)
Addended by: Deirdre Pippins on: 11/22/2016 07:13 AM   Modules accepted: Orders

## 2016-12-07 ENCOUNTER — Encounter: Payer: Self-pay | Admitting: Osteopathic Medicine

## 2016-12-07 LAB — RPR
Chlamydia Probe Amp: NEGATIVE
GC Probe Amp: NEGATIVE
RPR: NONREACTIVE

## 2016-12-08 ENCOUNTER — Telehealth: Payer: Self-pay | Admitting: Osteopathic Medicine

## 2016-12-08 DIAGNOSIS — M545 Low back pain, unspecified: Secondary | ICD-10-CM

## 2016-12-08 DIAGNOSIS — G8929 Other chronic pain: Secondary | ICD-10-CM

## 2016-12-08 MED ORDER — TRAMADOL HCL 50 MG PO TABS
50.0000 mg | ORAL_TABLET | Freq: Two times a day (BID) | ORAL | 0 refills | Status: DC | PRN
Start: 2016-12-08 — End: 2017-03-02

## 2016-12-08 NOTE — Telephone Encounter (Signed)
Pt called clinic stating her insurance will only pay for 2 week supply of the Tramadol Rx. Routing to PCP for eval of new Rx quantity.

## 2016-12-08 NOTE — Telephone Encounter (Signed)
This seems odd, I'm not sure why they would only pay for 2 weeks. Please call patient's pharmacy and see what the issue might be? Thank you

## 2016-12-08 NOTE — Telephone Encounter (Signed)
Fine by me 

## 2016-12-08 NOTE — Telephone Encounter (Signed)
Rx printed and placed in box for signature.

## 2016-12-08 NOTE — Telephone Encounter (Signed)
Called and spoke with pharmacy. Because this was the first time PCP had written Rx for Tramadol, they could only fill it for 1 week. After that, she can get 90 day refill as originally prescribed. This has to do with [place] laws to prevent dependence. Will route to PCP to sign off on Rx.

## 2016-12-26 ENCOUNTER — Ambulatory Visit (INDEPENDENT_AMBULATORY_CARE_PROVIDER_SITE_OTHER): Payer: BLUE CROSS/BLUE SHIELD | Admitting: Osteopathic Medicine

## 2016-12-26 ENCOUNTER — Encounter: Payer: Self-pay | Admitting: Osteopathic Medicine

## 2016-12-26 VITALS — BP 116/78 | HR 78 | Temp 98.0°F | Ht 63.0 in | Wt 264.4 lb

## 2016-12-26 DIAGNOSIS — B9789 Other viral agents as the cause of diseases classified elsewhere: Secondary | ICD-10-CM | POA: Diagnosis not present

## 2016-12-26 DIAGNOSIS — Z112 Encounter for screening for other bacterial diseases: Secondary | ICD-10-CM | POA: Diagnosis not present

## 2016-12-26 DIAGNOSIS — J069 Acute upper respiratory infection, unspecified: Secondary | ICD-10-CM | POA: Diagnosis not present

## 2016-12-26 LAB — POCT RAPID STREP A (OFFICE): RAPID STREP A SCREEN: NEGATIVE

## 2016-12-26 MED ORDER — PREDNISONE 20 MG PO TABS: 20.0000 mg | ORAL_TABLET | Freq: Two times a day (BID) | ORAL | 0 refills | Status: DC

## 2016-12-26 MED ORDER — BENZONATATE 200 MG PO CAPS: 200.0000 mg | ORAL_CAPSULE | Freq: Three times a day (TID) | ORAL | 0 refills | Status: DC | PRN

## 2016-12-26 MED ORDER — LIDOCAINE VISCOUS HCL 2 % MT SOLN: 10.0000 mL | OROMUCOSAL | 0 refills | Status: DC | PRN

## 2016-12-26 NOTE — Progress Notes (Signed)
HPI: Gabriella Williams is a 41 y.o. female with PMH  has a past medical history of BPPV (benign paroxysmal positional vertigo), left (09/13/2016).  who presents to [facility] today, 12/26/16,  for chief complaint of:  Chief Complaint  Patient presents with  . Sore Throat    Sore throat and hoarseness for about 4-5 days. No fever/chills. No nausea/vomiting. (+)dry cough.      Past medical, surgical, social and family history reviewed:  Patient Active Problem List   Diagnosis Date Noted  . Pain medication agreement completed 11/18/2016  . BPPV (benign paroxysmal positional vertigo), left 09/13/2016  . 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 Hypothyroidism 07/13/2016  . History of abnormal cervical Pap smear 07/13/2016  . Routine screening for STI (sexually transmitted infection) 07/13/2016  . Chronic bilateral low back pain without sciatica 07/13/2016  . Plantar fasciitis, bilateral 07/13/2016  . History of vitamin D deficiency 07/13/2016  . History of non anemic vitamin B12 deficiency 07/13/2016    Past Surgical History:  Procedure Laterality Date  . CESAREAN SECTION    . TONSILLECTOMY    . TUBAL LIGATION      Social History   Tobacco Use  . Smoking status: Never Smoker  . Smokeless tobacco: Never Used  Substance Use Topics  . Alcohol use: Not on file    Family History  Problem Relation Age of Onset  . Diabetes Mother   . Hyperlipidemia Mother   . Cancer Mother   . Breast cancer Mother   . Diabetes Father   . Cancer Father      Current medication list and allergy/intolerance information reviewed:    Current Outpatient Medications  Medication Sig Dispense Refill  . chlorhexidine (PERIDEX) 0.12 % solution Use as directed 15 mLs in the mouth or throat 2 (two) times daily. For 1-2 weeks 473 mL 0  . diclofenac (VOLTAREN) 75 MG EC tablet Take 75 mg by mouth 2 (two) times daily.    . diclofenac sodium (VOLTAREN) 1 % GEL Apply 4 g  topically 4 (four) times daily. As needed for pain, apply to affected joint/back. 100 g 5  . escitalopram (LEXAPRO) 20 MG tablet Take 20 mg by mouth daily.    Marland Kitchen levothyroxine (SYNTHROID, LEVOTHROID) 50 MCG tablet Take 50 mcg by mouth daily before breakfast.    . [START ON 02/10/2017] lisdexamfetamine (VYVANSE) 50 MG capsule Take 1 capsule (50 mg total) by mouth daily. 30 capsule 0  . meclizine (ANTIVERT) 25 MG tablet Take 1 tablet (25 mg total) by mouth 3 (three) times daily as needed for dizziness. 30 tablet 0  . meloxicam (MOBIC) 7.5 MG tablet Take 1-2 tablets (7.5-15 mg total) by mouth daily. As needed for pain 90 tablet 1  . traMADol (ULTRAM) 50 MG tablet Take 1 tablet (50 mg total) by mouth 2 (two) times daily as needed for severe pain (use sparingly to prevent tolerance/dependence). Chronic pain therapy. #90 for 90(ninety) days. 90 tablet 0  . Vitamin D, Ergocalciferol, (DRISDOL) 50000 units CAPS capsule Take 1 capsule (50,000 Units total) by mouth every 7 (seven) days. Take for 12 weeks 12 capsule 0   No current facility-administered medications for this visit.     Allergies  Allergen Reactions  . Clindamycin/Lincomycin     CHEST PAIN  . Penicillins Hives    HIVES      Review of Systems:  Constitutional:  No  fever, no chills, +recent illness, No unintentional weight changes. +significant fatigue.  HEENT: No  headache, no vision change, no hearing change, +sore throat, +sinus pressure  Cardiac: No  chest pain, No  pressure, No palpitations  Respiratory:  No  shortness of breath. +Cough  Gastrointestinal: No  abdominal pain, No  nausea, No  vomiting,  No  blood in stool, No  diarrhea  Musculoskeletal: No new myalgia/arthralgia  Skin: No  Rash   Exam:  BP 116/78   Pulse 78   Temp 98 F (36.7 C)   Ht 5\' 3"  (1.6 m)   Wt 264 lb 6.4 oz (119.9 kg)   SpO2 97%   BMI 46.84 kg/m   Constitutional: VS see above. General Appearance: alert, well-developed, well-nourished,  NAD  Eyes: Normal lids and conjunctive, non-icteric sclera  Ears, Nose, Mouth, Throat: MMM, Normal external inspection ears/nares/mouth/lips/gums. TM normal bilaterally. Pharynx +erythema, no exudate. Nasal mucosa normal.   Neck: No masses, trachea midline. No thyroid enlargement. No tenderness/mass appreciated. No lymphadenopathy  Respiratory: Normal respiratory effort. no wheeze, no rhonchi, no rales  Cardiovascular: S1/S2 normal, no murmur, no rub/gallop auscultated. RRR. No lower extremity edema.   Musculoskeletal: Gait normal.   Neurological: Normal balance/coordination. No tremor.   Skin: warm, dry, intact.   Psychiatric: Normal judgment/insight. Normal mood and affect. Oriented x3.    Results for orders placed or performed in visit on 12/26/16 (from the past 72 hour(s))  POCT rapid strep A     Status: None   Collection Time: 12/26/16  2:33 PM  Result Value Ref Range   Rapid Strep A Screen Negative Negative     ASSESSMENT/PLAN:   Viral URI with cough - Plan: predniSONE (DELTASONE) 20 MG tablet, Lidocaine HCl 2 % SOLN, benzonatate (TESSALON) 200 MG capsule  Screening for streptococcal infection - Plan: POCT rapid strep A    Patient Instructions  Over-the-Counter Medications & Home Remedies for Upper Respiratory Illness  Note: the following list assumes no pregnancy, normal liver & kidney function and no other drug interactions. Dr. Lyn HollingsheadAlexander has highlighted medications which are safe for you to use, but these may not be appropriate for everyone. Always ask a pharmacist or qualified medical provider if you have any questions!   Aches/Pains, Fever, Headache Acetaminophen (Tylenol) 500 mg tablets - take max 2 tablets (1000 mg) every 6 hours (4 times per day)  Ibuprofen (Motrin) 200 mg tablets - take max 4 tablets (800 mg) every 6 hours*  Sinus Congestion Prescription Atrovent as directed Cromolyn Nasal Spray (NasalCrom) 1 spray each nostril 3-4 times per day, max 6  imes per day Nasal Saline if desired Oxymetolazone (Afrin, others) sparing use due to rebound congestion, NEVER use in kids Phenylephrine (Sudafed) 10 mg tablets every 4 hours (or the 12-hour formulation)* Diphenhydramine (Benadryl) 25 mg tablets - take max 2 tablets every 4 hours  Cough & Sore Throat Prescription cough pills or syrups as directed Dextromethorphan (Robitussin, others) - cough suppressant Guaifenesin (Robitussin, Mucinex, others) - expectorant (helps cough up mucus) (Dextromethorphan and Guaifenesin also come in a combination tablet) Lozenges w/ Benzocaine + Menthol (Cepacol) Honey - as much as you want! Teas which "coat the throat" - look for ingredients Elm Bark, Licorice Root, Marshmallow Root  Other Antibiotics if these are prescribed - take ALL, even if you're feeling better  Zinc Lozenges within 24 hours of symptoms onset - mixed evidence this shortens the duration of the common cold Don't waste your money on Vitamin C or Echinacea  *Caution in patients with high blood pressure  Visit summary with medication list and pertinent instructions was printed for patient to review. All questions at time of visit were answered - patient instructed to contact office with any additional concerns. ER/RTC precautions were reviewed with the patient. Follow-up plan: Return if symptoms worsen or fail to improve.  Note: Total time spent 15 minutes, greater than 50% of the visit was spent face-to-face counseling and coordinating care for the following: The primary encounter diagnosis was Viral URI with cough. A diagnosis of Screening for streptococcal infection was also pertinent to this visit.Marland Kitchen  Please note: voice recognition software was used to produce this document, and typos may escape review. Please contact me for any needed clarifications.

## 2016-12-26 NOTE — Patient Instructions (Signed)
Over-the-Counter Medications & Home Remedies for Upper Respiratory Illness  Note: the following list assumes no pregnancy, normal liver & kidney function and no other drug interactions. Dr. Enrique Manganaro has highlighted medications which are safe for you to use, but these may not be appropriate for everyone. Always ask a pharmacist or qualified medical provider if you have any questions!   Aches/Pains, Fever, Headache Acetaminophen (Tylenol) 500 mg tablets - take max 2 tablets (1000 mg) every 6 hours (4 times per day)  Ibuprofen (Motrin) 200 mg tablets - take max 4 tablets (800 mg) every 6 hours*  Sinus Congestion Prescription Atrovent as directed Cromolyn Nasal Spray (NasalCrom) 1 spray each nostril 3-4 times per day, max 6 imes per day Nasal Saline if desired Oxymetolazone (Afrin, others) sparing use due to rebound congestion, NEVER use in kids Phenylephrine (Sudafed) 10 mg tablets every 4 hours (or the 12-hour formulation)* Diphenhydramine (Benadryl) 25 mg tablets - take max 2 tablets every 4 hours  Cough & Sore Throat Prescription cough pills or syrups as directed Dextromethorphan (Robitussin, others) - cough suppressant Guaifenesin (Robitussin, Mucinex, others) - expectorant (helps cough up mucus) (Dextromethorphan and Guaifenesin also come in a combination tablet) Lozenges w/ Benzocaine + Menthol (Cepacol) Honey - as much as you want! Teas which "coat the throat" - look for ingredients Elm Bark, Licorice Root, Marshmallow Root  Other Antibiotics if these are prescribed - take ALL, even if you're feeling better  Zinc Lozenges within 24 hours of symptoms onset - mixed evidence this shortens the duration of the common cold Don't waste your money on Vitamin C or Echinacea  *Caution in patients with high blood pressure    

## 2016-12-27 ENCOUNTER — Encounter: Payer: Self-pay | Admitting: Osteopathic Medicine

## 2017-01-03 ENCOUNTER — Encounter: Payer: Self-pay | Admitting: Osteopathic Medicine

## 2017-01-03 ENCOUNTER — Ambulatory Visit: Payer: BLUE CROSS/BLUE SHIELD | Admitting: Osteopathic Medicine

## 2017-01-03 DIAGNOSIS — Z0189 Encounter for other specified special examinations: Secondary | ICD-10-CM

## 2017-01-23 ENCOUNTER — Ambulatory Visit: Payer: BLUE CROSS/BLUE SHIELD | Admitting: Osteopathic Medicine

## 2017-01-23 ENCOUNTER — Telehealth: Payer: Self-pay

## 2017-01-23 NOTE — Telephone Encounter (Signed)
Per Dr. Lyn HollingsheadAlexander removed discharged pt status. PT is aware that she needs to allow 24 hr notice when not able to come to her appt.

## 2017-01-24 ENCOUNTER — Encounter: Payer: Self-pay | Admitting: Osteopathic Medicine

## 2017-01-24 ENCOUNTER — Ambulatory Visit (INDEPENDENT_AMBULATORY_CARE_PROVIDER_SITE_OTHER): Payer: BLUE CROSS/BLUE SHIELD | Admitting: Osteopathic Medicine

## 2017-01-24 VITALS — BP 142/92 | HR 88 | Wt 265.0 lb

## 2017-01-24 DIAGNOSIS — J3489 Other specified disorders of nose and nasal sinuses: Secondary | ICD-10-CM

## 2017-01-24 DIAGNOSIS — Z113 Encounter for screening for infections with a predominantly sexual mode of transmission: Secondary | ICD-10-CM

## 2017-01-24 DIAGNOSIS — H8112 Benign paroxysmal vertigo, left ear: Secondary | ICD-10-CM

## 2017-01-24 DIAGNOSIS — N898 Other specified noninflammatory disorders of vagina: Secondary | ICD-10-CM | POA: Diagnosis not present

## 2017-01-24 LAB — WET PREP FOR TRICH, YEAST, CLUE
MICRO NUMBER:: 81361530
SPECIMEN QUALITY 3963: ADEQUATE

## 2017-01-24 NOTE — Progress Notes (Signed)
HPI: Gabriella LeavellChristy Anne Williams is a 41 y.o. female who  has a past medical history of BPPV (benign paroxysmal positional vertigo), left (09/13/2016).  [facility] today, 01/24/17,  for chief complaint of:  Chief Complaint  Patient presents with  . nasal pain  . Vaginal Discharge    Nose: discomfort in nose ring area, no drainage, no fever/chills.   Vaginal discharge: recent sexual partner since last testing, she found out about this person having a pregnant ex-girlfriend which was hidden from her, she would like to get tested.   Persistent occasional vertigo/BPPV    Past medical, surgical, social and family history reviewed:  Patient Active Problem List   Diagnosis Date Noted  . Pain medication agreement completed 11/18/2016  . BPPV (benign paroxysmal positional vertigo), left 09/13/2016  . 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 Hypothyroidism 07/13/2016  . History of abnormal cervical Pap smear 07/13/2016  . Routine screening for STI (sexually transmitted infection) 07/13/2016  . Chronic bilateral low back pain without sciatica 07/13/2016  . Plantar fasciitis, bilateral 07/13/2016  . History of vitamin D deficiency 07/13/2016  . History of non anemic vitamin B12 deficiency 07/13/2016    Past Surgical History:  Procedure Laterality Date  . CESAREAN SECTION    . TONSILLECTOMY    . TUBAL LIGATION      Social History   Tobacco Use  . Smoking status: Never Smoker  . Smokeless tobacco: Never Used  Substance Use Topics  . Alcohol use: Not on file    Family History  Problem Relation Age of Onset  . Diabetes Mother   . Hyperlipidemia Mother   . Cancer Mother   . Breast cancer Mother   . Diabetes Father   . Cancer Father      Current medication list and allergy/intolerance information reviewed:    Current Outpatient Medications  Medication Sig Dispense Refill  . benzonatate (TESSALON) 200 MG capsule Take 1 capsule (200 mg  total) 3 (three) times daily as needed by mouth for cough. 30 capsule 0  . chlorhexidine (PERIDEX) 0.12 % solution Use as directed 15 mLs in the mouth or throat 2 (two) times daily. For 1-2 weeks 473 mL 0  . diclofenac (VOLTAREN) 75 MG EC tablet Take 75 mg by mouth 2 (two) times daily.    . diclofenac sodium (VOLTAREN) 1 % GEL Apply 4 g topically 4 (four) times daily. As needed for pain, apply to affected joint/back. 100 g 5  . escitalopram (LEXAPRO) 20 MG tablet Take 20 mg by mouth daily.    Marland Kitchen. levothyroxine (SYNTHROID, LEVOTHROID) 50 MCG tablet Take 50 mcg by mouth daily before breakfast.    . Lidocaine HCl 2 % SOLN Use as directed 10-15 mLs every 3 (three) hours as needed in the mouth or throat (mouth/throat pain). 100 mL 0  . [START ON 02/10/2017] lisdexamfetamine (VYVANSE) 50 MG capsule Take 1 capsule (50 mg total) by mouth daily. 30 capsule 0  . meclizine (ANTIVERT) 25 MG tablet Take 1 tablet (25 mg total) by mouth 3 (three) times daily as needed for dizziness. 30 tablet 0  . meloxicam (MOBIC) 7.5 MG tablet Take 1-2 tablets (7.5-15 mg total) by mouth daily. As needed for pain 90 tablet 1  . predniSONE (DELTASONE) 20 MG tablet Take 1 tablet (20 mg total) 2 (two) times daily with a meal by mouth. 10 tablet 0  . traMADol (ULTRAM) 50 MG tablet Take 1 tablet (50 mg total) by mouth 2 (  two) times daily as needed for severe pain (use sparingly to prevent tolerance/dependence). Chronic pain therapy. #90 for 90(ninety) days. 90 tablet 0  . Vitamin D, Ergocalciferol, (DRISDOL) 50000 units CAPS capsule Take 1 capsule (50,000 Units total) by mouth every 7 (seven) days. Take for 12 weeks 12 capsule 0   No current facility-administered medications for this visit.     Allergies  Allergen Reactions  . Clindamycin/Lincomycin     CHEST PAIN  . Penicillins Hives    HIVES      Review of Systems:  Constitutional:  No  fever, no chills, No recent illness  HEENT: No  headache, no vision change  Cardiac:  No  chest pain, No  pressure  Respiratory:  No  shortness of breath. No  Cough  Gastrointestinal: No  abdominal pain, No  nausea, No  vomiting,  No  blood in stool, No  diarrhea, No  constipation   Genitourinary: No  incontinence, No  abnormal genital bleeding, +abnormal genital discharge  Skin: No  Rash  Neurologic: No  weakness, +dizziness  Psychiatric: No  concerns with depression, No  concerns with anxiety  Exam:  BP (!) 142/92   Pulse 88   Wt 265 lb (120.2 kg)   BMI 46.94 kg/m   Constitutional: VS see above. General Appearance: alert, well-developed, well-nourished, NAD  Eyes: Normal lids and conjunctive, non-icteric sclera  Ears, Nose, Mouth, Throat: MMM, Normal external inspection ears/nares/mouth/lips/gums. Nasal ring on L, no drainage/erythema, non-tender   Neck: No masses, trachea midline.   Respiratory: Normal respiratory effort. no wheeze, no rhonchi, no rales  Cardiovascular: S1/S2 normal, no murmur, no rub/gallop auscultated. RRR.   Gastrointestinal: Nontender, no masses.  Musculoskeletal: Gait normal.   Neurological: Normal balance/coordination. No tremor.   Skin: warm, dry, intact.  Psychiatric: Normal judgment/insight. Normal mood and affect. Oriented x3.  GYN: No lesions/ulcers to external genitalia, normal urethra, normal vaginal mucosa, physiologic discharge, cervix normal without lesions, uterus not enlarged or tender, adnexa no masses and nontender    ASSESSMENT/PLAN:   Vaginal discharge - Plan: WET PREP FOR TRICH, YEAST, CLUE, C. trachomatis/N. gonorrhoeae RNA  Nasal pain  Routine screening for STI (sexually transmitted infection) - Plan: Hepatitis B core antibody, total, Hepatitis B surface antigen, HIV antibody, RPR, Hepatitis C antibody    Patient Instructions  Plan:  Nasal issue: try neosporin small amount on q-tip into nose   Vaginal discharge: may be normal discharge or may be infection, will see what swabs show, should have  some results back by tomorrow    Visit summary with medication list and pertinent instructions was printed for patient to review. All questions at time of visit were answered - patient instructed to contact office with any additional concerns. ER/RTC precautions were reviewed with the patient. Follow-up plan: Return if symptoms worsen or fail to improve.  Note: Total time spent 25 minutes, greater than 50% of the visit was spent face-to-face counseling and coordinating care for the following: The primary encounter diagnosis was Vaginal discharge. Diagnoses of Nasal pain, Routine screening for STI (sexually transmitted infection), and BPPV (benign paroxysmal positional vertigo), left were also pertinent to this visit.Marland Kitchen.  Please note: voice recognition software was used to produce this document, and typos may escape review. Please contact Dr. Lyn HollingsheadAlexander for any needed clarifications.

## 2017-01-24 NOTE — Patient Instructions (Signed)
Plan:  Nasal issue: try neosporin small amount on q-tip into nose   Vaginal discharge: may be normal discharge or may be infection, will see what swabs show, should have some results back by tomorrow

## 2017-01-25 ENCOUNTER — Other Ambulatory Visit: Payer: Self-pay | Admitting: Osteopathic Medicine

## 2017-01-25 LAB — HEPATITIS C ANTIBODY
Hepatitis C Ab: NONREACTIVE
SIGNAL TO CUT-OFF: 0.02 (ref <1.00)

## 2017-01-25 LAB — HEPATITIS B CORE ANTIBODY, TOTAL: HEP B C TOTAL AB: NONREACTIVE

## 2017-01-25 LAB — C. TRACHOMATIS/N. GONORRHOEAE RNA

## 2017-01-25 LAB — RPR: RPR Ser Ql: NONREACTIVE

## 2017-01-25 LAB — HEPATITIS B SURFACE ANTIGEN: HEP B S AG: NONREACTIVE

## 2017-01-25 LAB — HIV ANTIBODY (ROUTINE TESTING W REFLEX): HIV 1&2 Ab, 4th Generation: NONREACTIVE

## 2017-01-25 NOTE — Addendum Note (Signed)
Addended by: Osa CraverGUIJOZA, Vayda Dungee M on: 01/25/2017 11:12 AM   Modules accepted: Orders

## 2017-01-25 NOTE — Progress Notes (Signed)
GC/CT ordered per Dr. Lyn HollingsheadAlexander request.

## 2017-01-25 NOTE — Progress Notes (Signed)
GC/CT swab incorrect swab sent need urine test

## 2017-02-03 ENCOUNTER — Ambulatory Visit: Payer: BLUE CROSS/BLUE SHIELD

## 2017-02-07 ENCOUNTER — Other Ambulatory Visit: Payer: Self-pay | Admitting: Osteopathic Medicine

## 2017-02-07 DIAGNOSIS — M545 Low back pain, unspecified: Secondary | ICD-10-CM

## 2017-02-07 DIAGNOSIS — G8929 Other chronic pain: Secondary | ICD-10-CM

## 2017-02-07 DIAGNOSIS — M722 Plantar fascial fibromatosis: Secondary | ICD-10-CM

## 2017-03-02 ENCOUNTER — Other Ambulatory Visit: Payer: Self-pay | Admitting: Osteopathic Medicine

## 2017-03-02 DIAGNOSIS — G8929 Other chronic pain: Secondary | ICD-10-CM

## 2017-03-02 DIAGNOSIS — M545 Low back pain, unspecified: Secondary | ICD-10-CM

## 2017-03-03 NOTE — Telephone Encounter (Signed)
Pharmacy requesting refills.

## 2017-03-06 ENCOUNTER — Telehealth: Payer: Self-pay | Admitting: Osteopathic Medicine

## 2017-03-06 NOTE — Telephone Encounter (Signed)
Please call patient: I just responded to the pharmacies refill request for tramadol. Prescription monitoring program was reviewed, last fill was 12/10/2016 for 90 days. 90 days will be up on 03/12/2017, I wrote prescription to fill on the 18th since the 20th is a Sunday. Further authorizations of refills will require an office visit for continuation of controlled substances

## 2017-03-06 NOTE — Telephone Encounter (Signed)
Left VM with PCP update.

## 2017-03-13 ENCOUNTER — Ambulatory Visit: Payer: BLUE CROSS/BLUE SHIELD | Admitting: Osteopathic Medicine

## 2017-03-13 DIAGNOSIS — Z0189 Encounter for other specified special examinations: Secondary | ICD-10-CM

## 2017-03-20 ENCOUNTER — Telehealth: Payer: Self-pay | Admitting: Osteopathic Medicine

## 2017-03-20 NOTE — Telephone Encounter (Signed)
Please call patient: I filled out the form for her Vyvanse and should be sent to fax. She will need to confirm that they received it. Let us know if any problems

## 2017-03-22 ENCOUNTER — Telehealth: Payer: Self-pay | Admitting: Osteopathic Medicine

## 2017-03-22 NOTE — Telephone Encounter (Signed)
Left VM for Pt to return clinic call.  

## 2017-03-22 NOTE — Telephone Encounter (Signed)
Lease call patient: We received notification that her prescription assistance application was denied due to not providing proof of income. Would have her contact [employer] at [phone] if any questions  If there is anything else she needs from this office, let me know.

## 2017-03-27 NOTE — Telephone Encounter (Signed)
Left VM with denial information. Callback provided for any questions.

## 2017-03-28 ENCOUNTER — Other Ambulatory Visit: Payer: Self-pay

## 2017-03-28 DIAGNOSIS — F5081 Binge eating disorder: Secondary | ICD-10-CM

## 2017-03-28 NOTE — Telephone Encounter (Signed)
Preet called for a refill on Vyvanse.

## 2017-03-29 ENCOUNTER — Telehealth: Payer: Self-pay | Admitting: Osteopathic Medicine

## 2017-03-29 MED ORDER — LISDEXAMFETAMINE DIMESYLATE 50 MG PO CAPS: 50.0000 mg | ORAL_CAPSULE | Freq: Every day | ORAL | 0 refills | Status: DC

## 2017-03-29 NOTE — Telephone Encounter (Signed)
Left a message advising.

## 2017-03-29 NOTE — Telephone Encounter (Signed)
Received fax for PA on Vyvanse sent through cover my meds and received authorization. I will notify pharmacy - CF  Case ID  [id number] Valid: 02/27/17 - 03/29/2018

## 2017-03-29 NOTE — Telephone Encounter (Signed)
Sent to [facility]

## 2017-03-31 ENCOUNTER — Other Ambulatory Visit: Payer: Self-pay

## 2017-03-31 ENCOUNTER — Emergency Department (INDEPENDENT_AMBULATORY_CARE_PROVIDER_SITE_OTHER)
Admission: EM | Admit: 2017-03-31 | Discharge: 2017-03-31 | Disposition: A | Discharge status: Home / Self Care | Payer: BLUE CROSS/BLUE SHIELD | Source: Home / Self Care | Attending: Family Medicine | Admitting: Family Medicine

## 2017-03-31 ENCOUNTER — Encounter: Payer: Self-pay | Admitting: Emergency Medicine

## 2017-03-31 DIAGNOSIS — F5081 Binge eating disorder: Secondary | ICD-10-CM

## 2017-03-31 DIAGNOSIS — H6691 Otitis media, unspecified, right ear: Secondary | ICD-10-CM

## 2017-03-31 DIAGNOSIS — J0110 Acute frontal sinusitis, unspecified: Secondary | ICD-10-CM

## 2017-03-31 MED ORDER — LISDEXAMFETAMINE DIMESYLATE 50 MG PO CAPS: 50.0000 mg | ORAL_CAPSULE | Freq: Every day | ORAL | 0 refills | Status: DC

## 2017-03-31 MED ORDER — AZITHROMYCIN 250 MG PO TABS: 250.0000 mg | ORAL_TABLET | Freq: Every day | ORAL | 0 refills | Status: DC

## 2017-03-31 MED ORDER — ACETAMINOPHEN 325 MG PO TABS
975.0000 mg | ORAL_TABLET | Freq: Once | ORAL | Status: AC
  Administered 2017-03-31: 975 mg via ORAL

## 2017-03-31 MED ORDER — FLUTICASONE PROPIONATE 50 MCG/ACT NA SUSP: 2.0000 | Freq: Every day | NASAL | 2 refills | Status: AC

## 2017-03-31 NOTE — ED Triage Notes (Signed)
Reports 4 days of cough, right ear pain, and sore throat. No OTC today.

## 2017-03-31 NOTE — Discharge Instructions (Signed)
°  You may take 500mg acetaminophen every 4-6 hours or in combination with ibuprofen 400-600mg every 6-8 hours as needed for pain, inflammation, and fever. ° °Be sure to drink at least eight 8oz glasses of water to stay well hydrated and get at least 8 hours of sleep at night, preferably more while sick.  ° °Please take antibiotics as prescribed and be sure to complete entire course even if you start to feel better to ensure infection does not come back. ° °

## 2017-03-31 NOTE — ED Provider Notes (Signed)
[facility]    CSN: [id number] Arrival date & time: 03/31/17  5621     History   Chief Complaint Chief Complaint  Patient presents with  . Otalgia    right  . Sore Throat  . Cough    HPI Gabriella Williams is a 42 y.o. female.   HPI Gabriella Williams is a 42 y.o. female presenting to UC with c/o 4 days of mildly productive cough, Right ear pain and sore throat. Right ear pain is most bothersome for the pt.  She has not tried any OTC medication today but she did have some Tylenol yesterday. No known sick contacts. denies fever, chills, n/v/d.   Past Medical History:  Diagnosis Date  . BPPV (benign paroxysmal positional vertigo), left 09/13/2016    Patient Active Problem List   Diagnosis Date Noted  . Vaginal discharge 01/24/2017  . Pain medication agreement completed 11/18/2016  . BPPV (benign paroxysmal positional vertigo), left 09/13/2016  . 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 Hypothyroidism 07/13/2016  . History of abnormal cervical Pap smear 07/13/2016  . Routine screening for STI (sexually transmitted infection) 07/13/2016  . Chronic bilateral low back pain without sciatica 07/13/2016  . Plantar fasciitis, bilateral 07/13/2016  . History of vitamin D deficiency 07/13/2016  . History of non anemic vitamin B12 deficiency 07/13/2016    Past Surgical History:  Procedure Laterality Date  . CESAREAN SECTION    . TONSILLECTOMY    . TUBAL LIGATION      OB History    No data available       Home Medications    Prior to Admission medications   Medication Sig Start Date End Date Taking? Authorizing Provider  azithromycin (ZITHROMAX) 250 MG tablet Take 1 tablet (250 mg total) by mouth daily. Take first 2 tablets together, then 1 every day until finished. 03/31/17   Lurene Shadow, PA-C  chlorhexidine (PERIDEX) 0.12 % solution Use as directed 15 mLs in the mouth or throat 2 (two) times daily. For 1-2 weeks 11/18/16   Sunnie Nielsen, DO  diclofenac sodium  (VOLTAREN) 1 % GEL Apply 4 g topically 4 (four) times daily. As needed for pain, apply to affected joint/back. 07/12/16   Sunnie Nielsen, DO  escitalopram (LEXAPRO) 20 MG tablet Take 20 mg by mouth daily.    [provider]  fluticasone (FLONASE) 50 MCG/ACT nasal spray Place 2 sprays into both nostrils daily. 03/31/17   Lurene Shadow, PA-C  levothyroxine (SYNTHROID, LEVOTHROID) 50 MCG tablet Take 50 mcg by mouth daily before breakfast.    [provider]  lisdexamfetamine (VYVANSE) 50 MG capsule Take 1 capsule (50 mg total) by mouth daily. 03/29/17 04/28/17  Sunnie Nielsen, DO  meclizine (ANTIVERT) 25 MG tablet Take 1 tablet (25 mg total) by mouth 3 (three) times daily as needed for dizziness. 09/13/16   Sunnie Nielsen, DO  meloxicam (MOBIC) 7.5 MG tablet TAKE 1 TO 2 TABLETS BY MOUTH ONCE DAILY AS NEEDED FOR PAIN 02/07/17   Sunnie Nielsen, DO  traMADol (ULTRAM) 50 MG tablet TAKE 1 TABLET BY MOUTH TWICE DAILY AS NEEDED FOR SEVERE PAIN FOR 90 DAYS. USE SPARINGLY TO PREVENT TOLERANCE/DEPENDENCE 03/10/17 06/08/17  Sunnie Nielsen, DO  Vitamin D, Ergocalciferol, (DRISDOL) 50000 units CAPS capsule Take 1 capsule (50,000 Units total) by mouth every 7 (seven) days. Take for 12 weeks 11/22/16   Sunnie Nielsen, DO    Family History Family History  Problem Relation Age of Onset  . Diabetes Mother   .  Hyperlipidemia Mother   . Cancer Mother   . Breast cancer Mother   . Diabetes Father   . Cancer Father     Social History Social History   Tobacco Use  . Smoking status: Never Smoker  . Smokeless tobacco: Never Used  Substance Use Topics  . Alcohol use: Not on file  . Drug use: Not on file     Allergies   Clindamycin/lincomycin and Penicillins   Review of Systems Review of Systems  Constitutional: Negative for chills and fever.  HENT: Positive for congestion, ear pain (Right), postnasal drip and sore throat. Negative for trouble swallowing and voice change.     Respiratory: Positive for cough. Negative for shortness of breath.   Cardiovascular: Negative for chest pain and palpitations.  Gastrointestinal: Negative for abdominal pain, diarrhea, nausea and vomiting.  Musculoskeletal: Negative for arthralgias, back pain and myalgias.  Skin: Negative for rash.  Neurological: Positive for headaches. Negative for dizziness and light-headedness.     Physical Exam Triage Vital Signs ED Triage Vitals [03/31/17 1005]  Enc Vitals Group     BP 123/84     Pulse Rate 85     Resp 16     Temp 98.4 F (36.9 C)     Temp Source Oral     SpO2 98 %     Weight 262 lb (118.8 kg)     Height 5\' 3"  (1.6 m)     Head Circumference      Peak Flow      Pain Score      Pain Loc      Pain Edu?      Excl. in GC?    No data found.  Updated Vital Signs BP 123/84 (BP Location: Right Arm)   Pulse 85   Temp 98.4 F (36.9 C) (Oral)   Resp 16   Ht 5\' 3"  (1.6 m)   Wt 262 lb (118.8 kg)   LMP 03/28/2017 (Exact Date)   SpO2 98%   BMI 46.41 kg/m   Visual Acuity Right Eye Distance:   Left Eye Distance:   Bilateral Distance:    Right Eye Near:   Left Eye Near:    Bilateral Near:     Physical Exam  Constitutional: She is oriented to person, place, and time. She appears well-developed and well-nourished.  Non-toxic appearance. She does not appear ill. No distress.  HENT:  Head: Normocephalic and atraumatic.  Right Ear: Tympanic membrane is erythematous. Tympanic membrane is not bulging. A middle ear effusion is present.  Left Ear: Tympanic membrane normal.  Nose: Mucosal edema present. Right sinus exhibits frontal sinus tenderness. Right sinus exhibits no maxillary sinus tenderness. Left sinus exhibits frontal sinus tenderness. Left sinus exhibits no maxillary sinus tenderness.  Mouth/Throat: Uvula is midline, oropharynx is clear and moist and mucous membranes are normal.  Eyes: EOM are normal.  Neck: Normal range of motion. Neck supple. No thyromegaly  present.  Cardiovascular: Normal rate.  Pulmonary/Chest: Effort normal and breath sounds normal. No stridor. No respiratory distress. She has no wheezes. She has no rhonchi.  Musculoskeletal: Normal range of motion.  Lymphadenopathy:    She has no cervical adenopathy.  Neurological: She is alert and oriented to person, place, and time.  Skin: Skin is warm and dry.  Psychiatric: She has a normal mood and affect. Her behavior is normal.  Nursing note and vitals reviewed.    UC Treatments / Results  Labs (all labs ordered are listed, but only abnormal  results are displayed) Labs Reviewed - No data to display  EKG  EKG Interpretation None       Radiology No results found.  Procedures Procedures (including critical care time)  Medications Ordered in UC Medications  acetaminophen (TYLENOL) tablet 975 mg (975 mg Oral Given 03/31/17 1007)     Initial Impression / Assessment and Plan / UC Course  I have reviewed the triage vital signs and the nursing notes.  Pertinent labs & imaging results that were available during my care of the patient were reviewed by me and considered in my medical decision making (see chart for details).     Hx and exam c/w Right AOM and frontal sinusitis Pt allergic to PCN and clindamycin, will tx with Azithromycin (has had before w/o reaction)  F/u with PCP in 1 week if needed   Final Clinical Impressions(s) / UC Diagnoses   Final diagnoses:  Right acute otitis media  Acute non-recurrent frontal sinusitis    ED Discharge Orders        Ordered    azithromycin (ZITHROMAX) 250 MG tablet  Daily     03/31/17 1026    fluticasone (FLONASE) 50 MCG/ACT nasal spray  Daily     03/31/17 1026       Controlled Substance Prescriptions [place] Controlled Substance Registry consulted? Not Applicable   Rolla Plate 03/31/17 1115

## 2017-03-31 NOTE — Telephone Encounter (Signed)
Gabriella Williams called and states she forgot to tell us to [facility]  [facility]

## 2017-04-02 ENCOUNTER — Telehealth: Payer: Self-pay | Admitting: Emergency Medicine

## 2017-04-02 NOTE — Telephone Encounter (Signed)
Left message for patient advising that if doing well can disregard call, any questions or concerns will follow up with PCP.

## 2017-04-04 ENCOUNTER — Ambulatory Visit (INDEPENDENT_AMBULATORY_CARE_PROVIDER_SITE_OTHER): Payer: BLUE CROSS/BLUE SHIELD

## 2017-04-04 ENCOUNTER — Ambulatory Visit (INDEPENDENT_AMBULATORY_CARE_PROVIDER_SITE_OTHER): Payer: BLUE CROSS/BLUE SHIELD | Admitting: Physician Assistant

## 2017-04-04 ENCOUNTER — Encounter: Payer: Self-pay | Admitting: Physician Assistant

## 2017-04-04 VITALS — BP 119/59 | HR 72 | Temp 98.0°F | Ht 63.0 in | Wt 263.0 lb

## 2017-04-04 DIAGNOSIS — R05 Cough: Secondary | ICD-10-CM | POA: Diagnosis not present

## 2017-04-04 DIAGNOSIS — J209 Acute bronchitis, unspecified: Secondary | ICD-10-CM | POA: Diagnosis not present

## 2017-04-04 DIAGNOSIS — M546 Pain in thoracic spine: Secondary | ICD-10-CM

## 2017-04-04 DIAGNOSIS — R058 Other specified cough: Secondary | ICD-10-CM

## 2017-04-04 MED ORDER — PREDNISONE 50 MG PO TABS: ORAL_TABLET | ORAL | 0 refills | Status: DC

## 2017-04-04 MED ORDER — METHYLPREDNISOLONE 4 MG PO TBPK: ORAL_TABLET | ORAL | 0 refills | Status: DC

## 2017-04-04 MED ORDER — HYDROCODONE-HOMATROPINE 5-1.5 MG/5ML PO SYRP: 5.0000 mL | ORAL_SOLUTION | Freq: Two times a day (BID) | ORAL | 0 refills | Status: AC | PRN

## 2017-04-04 NOTE — Progress Notes (Signed)
Call pt: no pneumonia. Bronchitic changes. Zpak is still working add prednisone for 5 days and hycodan for cough.

## 2017-04-04 NOTE — Progress Notes (Signed)
   Subjective:    Patient ID: Gabriella Williams, female    DOB: 03/21/1975, 42 y.o.   MRN: [id number]  HPI  Pt is a 42 yo female who presents to the clinic to discuss ongoing cough, sinus pressure. Pt was seen on 03/31/17 and treated for right AOM/sinusitis and given zpak and flonase. She did get some better but still feels a lot of sinus pressure and cough. Her cough is dry and has started to ache in the mid back area. At times she does feel hot and cold but not checked her temperature. She is taking mucinex D as well with little relief. She took her last zpak pill today. Denies any nausea, SOB, wheezing, body aches.   .. Active Ambulatory Problems    Diagnosis Date Noted  . Hypothyroidism 07/13/2016  . History of abnormal cervical Pap smear 07/13/2016  . Routine screening for STI (sexually transmitted infection) 07/13/2016  . Chronic bilateral low back pain without sciatica 07/13/2016  . Plantar fasciitis, bilateral 07/13/2016  . History of vitamin D deficiency 07/13/2016  . History of non anemic vitamin B12 deficiency 07/13/2016  . 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 BPPV (benign paroxysmal positional vertigo), left 09/13/2016  . Pain medication agreement completed 11/18/2016  . Vaginal discharge 01/24/2017   Resolved Ambulatory Problems    Diagnosis Date Noted  . No Resolved Ambulatory Problems   Past Medical History:  Diagnosis Date  . BPPV (benign paroxysmal positional vertigo), left 09/13/2016      Review of Systems  All other systems reviewed and are negative.      Objective:   Physical Exam  Constitutional: She is oriented to person, place, and time. She appears well-developed and well-nourished.  HENT:  Head: Normocephalic and atraumatic.  Right Ear: External ear normal.  Left Ear: External ear normal.  Nose: Nose normal.  Mouth/Throat: Oropharynx is clear and moist.  TM's clear bilaterally.  No sinus tenderness.  No tonsilar swelling or exudate.   Eyes:  Conjunctivae are normal. Right eye exhibits no discharge. Left eye exhibits no discharge.  Neck: Normal range of motion. Neck supple.  Cardiovascular: Normal rate, regular rhythm and normal heart sounds.  Pulmonary/Chest: Effort normal and breath sounds normal. She has no wheezes.  Lymphadenopathy:    She has cervical adenopathy.  Neurological: She is alert and oriented to person, place, and time.  Skin:  Flushed cheeks.   Psychiatric: She has a normal mood and affect. Her behavior is normal.          Assessment & Plan:  Marland Kitchen.Marland Kitchen.Gabriella Williams was seen today for back pain and cough.  Diagnoses and all orders for this visit:  Post-viral cough syndrome -     DG Chest 2 View -     HYDROcodone-homatropine (HYCODAN) 5-1.5 MG/5ML syrup; Take 5 mLs by mouth every 12 (twelve) hours as needed. -     predniSONE (DELTASONE) 50 MG tablet; One tab PO daily for 5 days.  Acute bronchitis, unspecified organism  Other orders -     Discontinue: methylPREDNISolone (MEDROL DOSEPAK) 4 MG TBPK tablet; Take as directed by package insert  due to ongoing cough CXR was ordered and showed minimal bronchitic changes. It appears patient is continuing to have some bronchitis wtith a lingering cough. Add prednisone for 5 days and cough syrup. At this time PE and vitals are stable. HO given to discuss flonase, humidifer, cough drops. Follow up as needed or sooner if symptoms worsen.

## 2017-04-05 ENCOUNTER — Encounter: Payer: Self-pay | Admitting: Physician Assistant

## 2017-04-11 ENCOUNTER — Telehealth: Payer: Self-pay | Admitting: Physician Assistant

## 2017-04-11 MED ORDER — ALBUTEROL SULFATE HFA 108 (90 BASE) MCG/ACT IN AERS: 2.0000 | INHALATION_SPRAY | RESPIRATORY_TRACT | 0 refills | Status: AC | PRN

## 2017-04-11 NOTE — Telephone Encounter (Signed)
Consider flonase OTC 2 sprays each nostril, suck on zinc tablets, stay hydrated, use humidifer. Let me know how doing in a few days.

## 2017-04-11 NOTE — Telephone Encounter (Signed)
Pt called and stated you diagnosed her with bronchitis and she said she started to feel better but now she feels like it is coming back . She said her throat feels scratchy. She would like to know if she needs to come back in to see you or if there is something you can send her in. Thanks

## 2017-04-11 NOTE — Telephone Encounter (Signed)
She had CXR on 2/12 which was negative for pneumonia. Ok to send albuterol 2 puffs every 2-4 hours as needed for cough or SOB. If not improving come in for reevalution in next 2 days.

## 2017-04-11 NOTE — Addendum Note (Signed)
Addended by: Collie SiadICHARDSON, Permelia Bamba M on: 04/11/2017 03:47 PM   Modules accepted: Orders

## 2017-04-11 NOTE — Telephone Encounter (Signed)
Pt given message and she added that her lung is hurting.

## 2017-04-11 NOTE — Telephone Encounter (Signed)
Rx sent. Left VM for Pt with update and recommendation.

## 2017-04-13 ENCOUNTER — Encounter: Payer: Self-pay | Admitting: Physician Assistant

## 2017-04-13 ENCOUNTER — Ambulatory Visit (INDEPENDENT_AMBULATORY_CARE_PROVIDER_SITE_OTHER): Payer: BLUE CROSS/BLUE SHIELD | Admitting: Physician Assistant

## 2017-04-13 VITALS — BP 118/80 | HR 87 | Temp 98.2°F | Wt 261.0 lb

## 2017-04-13 DIAGNOSIS — B9689 Other specified bacterial agents as the cause of diseases classified elsewhere: Secondary | ICD-10-CM

## 2017-04-13 DIAGNOSIS — J019 Acute sinusitis, unspecified: Secondary | ICD-10-CM | POA: Diagnosis not present

## 2017-04-13 MED ORDER — FLUCONAZOLE 150 MG PO TABS: 150.0000 mg | ORAL_TABLET | Freq: Once | ORAL | 0 refills | Status: AC

## 2017-04-13 MED ORDER — ERGOCALCIFEROL 50 MCG (2000 UT) PO CAPS: 1.0000 | ORAL_CAPSULE | Freq: Every day | ORAL | Status: AC

## 2017-04-13 MED ORDER — CEFUROXIME AXETIL 250 MG PO TABS: 250.0000 mg | ORAL_TABLET | Freq: Two times a day (BID) | ORAL | 0 refills | Status: AC

## 2017-04-13 NOTE — Patient Instructions (Addendum)
- continue your Flonase daily - nasal saline rinses/netty pot - antibiotic twice a day for full 10 days. Complete course even if feeling better    Sinusitis, Adult Sinusitis is soreness and inflammation of your sinuses. Sinuses are hollow spaces in the bones around your face. Your sinuses are located:  Around your eyes.  In the middle of your forehead.  Behind your nose.  In your cheekbones.  Your sinuses and nasal passages are lined with a stringy fluid (mucus). Mucus normally drains out of your sinuses. When your nasal tissues become inflamed or swollen, the mucus can become trapped or blocked so air cannot flow through your sinuses. This allows bacteria, viruses, and funguses to grow, which leads to infection. Sinusitis can develop quickly and last for 7?10 days (acute) or for more than 12 weeks (chronic). Sinusitis often develops after a cold. What are the causes? This condition is caused by anything that creates swelling in the sinuses or stops mucus from draining, including:  Allergies.  Asthma.  Bacterial or viral infection.  Abnormally shaped bones between the nasal passages.  Nasal growths that contain mucus (nasal polyps).  Narrow sinus openings.  Pollutants, such as chemicals or irritants in the air.  A foreign object stuck in the nose.  A fungal infection. This is rare.  What increases the risk? The following factors may make you more likely to develop this condition:  Having allergies or asthma.  Having had a recent cold or respiratory tract infection.  Having structural deformities or blockages in your nose or sinuses.  Having a weak immune system.  Doing a lot of swimming or diving.  Overusing nasal sprays.  Smoking.  What are the signs or symptoms? The main symptoms of this condition are pain and a feeling of pressure around the affected sinuses. Other symptoms include:  Upper toothache.  Earache.  Headache.  Bad breath.  Decreased  sense of smell and taste.  A cough that may get worse at night.  Fatigue.  Fever.  Thick drainage from your nose. The drainage is often green and it may contain pus (purulent).  Stuffy nose or congestion.  Postnasal drip. This is when extra mucus collects in the throat or back of the nose.  Swelling and warmth over the affected sinuses.  Sore throat.  Sensitivity to light.  How is this diagnosed? This condition is diagnosed based on symptoms, a medical history, and a physical exam. To find out if your condition is acute or chronic, your health care provider may:  Look in your nose for signs of nasal polyps.  Tap over the affected sinus to check for signs of infection.  View the inside of your sinuses using an imaging device that has a light attached (endoscope).  If your health care provider suspects that you have chronic sinusitis, you may also:  Be tested for allergies.  Have a sample of mucus taken from your nose (nasal culture) and checked for bacteria.  Have a mucus sample examined to see if your sinusitis is related to an allergy.  If your sinusitis does not respond to treatment and it lasts longer than 8 weeks, you may have an MRI or CT scan to check your sinuses. These scans also help to determine how severe your infection is. In rare cases, a bone biopsy may be done to rule out more serious types of fungal sinus disease. How is this treated? Treatment for sinusitis depends on the cause and whether your condition is chronic or  acute. If a virus is causing your sinusitis, your symptoms will go away on their own within 10 days. You may be given medicines to relieve your symptoms, including:  Topical nasal decongestants. They shrink swollen nasal passages and let mucus drain from your sinuses.  Antihistamines. These drugs block inflammation that is triggered by allergies. This can help to ease swelling in your nose and sinuses.  Topical nasal corticosteroids. These  are nasal sprays that ease inflammation and swelling in your nose and sinuses.  Nasal saline washes. These rinses can help to get rid of thick mucus in your nose.  If your condition is caused by bacteria, you will be given an antibiotic medicine. If your condition is caused by a fungus, you will be given an antifungal medicine. Surgery may be needed to correct underlying conditions, such as narrow nasal passages. Surgery may also be needed to remove polyps. Follow these instructions at home: Medicines  Take, use, or apply over-the-counter and prescription medicines only as told by your health care provider. These may include nasal sprays.  If you were prescribed an antibiotic medicine, take it as told by your health care provider. Do not stop taking the antibiotic even if you start to feel better. Hydrate and Humidify  Drink enough water to keep your urine clear or pale yellow. Staying hydrated will help to thin your mucus.  Use a cool mist humidifier to keep the humidity level in your home above 50%.  Inhale steam for 10-15 minutes, 3-4 times a day or as told by your health care provider. You can do this in the bathroom while a hot shower is running.  Limit your exposure to cool or dry air. Rest  Rest as much as possible.  Sleep with your head raised (elevated).  Make sure to get enough sleep each night. General instructions  Apply a warm, moist washcloth to your face 3-4 times a day or as told by your health care provider. This will help with discomfort.  Wash your hands often with soap and water to reduce your exposure to viruses and other germs. If soap and water are not available, use hand sanitizer.  Do not smoke. Avoid being around people who are smoking (secondhand smoke).  Keep all follow-up visits as told by your health care provider. This is important. Contact a health care provider if:  You have a fever.  Your symptoms get worse.  Your symptoms do not improve  within 10 days. Get help right away if:  You have a severe headache.  You have persistent vomiting.  You have pain or swelling around your face or eyes.  You have vision problems.  You develop confusion.  Your neck is stiff.  You have trouble breathing. This information is not intended to replace advice given to you by your health care provider. Make sure you discuss any questions you have with your health care provider. Document Released: 02/07/2005 Document Revised: 10/04/2015 Document Reviewed: 12/03/2014 Elsevier Interactive Patient Education  [employer]2018 [employer]

## 2017-04-13 NOTE — Progress Notes (Signed)
HPI:                                                                Gabriella LeavellChristy Anne Williams is a 42 y.o. female who [facility] today for sinusitis  Patient reports she has had cough and congestion for nearly 3 weeks. She was diagnosed with bronchitis which was seen in on chest x-ray, and treated with Azithromycin and Prednisone.  She was feeling better until about 2 days ago when she had second sickening that included worsening chills, congestion, purulent rhinorrhea, and sinus pressure. She also continues to endorse a non-productive persistent cough. Denies fever, hemoptysis, dyspnea.  No flowsheet data found.    Past Medical History:  Diagnosis Date  . BPPV (benign paroxysmal positional vertigo), left 09/13/2016   Past Surgical History:  Procedure Laterality Date  . CESAREAN SECTION    . TONSILLECTOMY    . TUBAL LIGATION     Social History   Tobacco Use  . Smoking status: Never Smoker  . Smokeless tobacco: Never Used  Substance Use Topics  . Alcohol use: Not on file   family history includes Breast cancer in her mother; Cancer in her father and mother; Diabetes in her father and mother; Hyperlipidemia in her mother.    ROS: negative except as noted in the HPI  Medications: Current Outpatient Medications  Medication Sig Dispense Refill  . albuterol (PROVENTIL HFA;VENTOLIN HFA) 108 (90 Base) MCG/ACT inhaler Inhale 2 puffs into the lungs every 4 (four) hours as needed for wheezing or shortness of breath. 1 Inhaler 0  . chlorhexidine (PERIDEX) 0.12 % solution Use as directed 15 mLs in the mouth or throat 2 (two) times daily. For 1-2 weeks 473 mL 0  . diclofenac sodium (VOLTAREN) 1 % GEL Apply 4 g topically 4 (four) times daily. As needed for pain, apply to affected joint/back. 100 g 5  . escitalopram (LEXAPRO) 20 MG tablet Take 20 mg by mouth daily.    . fluticasone (FLONASE) 50 MCG/ACT nasal spray Place 2 sprays into  both nostrils daily. 16 g 2  . HYDROcodone-homatropine (HYCODAN) 5-1.5 MG/5ML syrup Take 5 mLs by mouth every 12 (twelve) hours as needed. 50 mL 0  . levothyroxine (SYNTHROID, LEVOTHROID) 50 MCG tablet Take 50 mcg by mouth daily before breakfast.    . lisdexamfetamine (VYVANSE) 50 MG capsule Take 1 capsule (50 mg total) by mouth daily. 30 capsule 0  . meclizine (ANTIVERT) 25 MG tablet Take 1 tablet (25 mg total) by mouth 3 (three) times daily as needed for dizziness. 30 tablet 0  . meloxicam (MOBIC) 7.5 MG tablet TAKE 1 TO 2 TABLETS BY MOUTH ONCE DAILY AS NEEDED FOR PAIN 90 tablet 1  . traMADol (ULTRAM) 50 MG tablet TAKE 1 TABLET BY MOUTH TWICE DAILY AS NEEDED FOR SEVERE PAIN FOR 90 DAYS. USE SPARINGLY TO PREVENT TOLERANCE/DEPENDENCE 90 tablet 0  . Vitamin D, Ergocalciferol, (DRISDOL) 50000 units CAPS capsule Take 1 capsule (50,000 Units total) by mouth every 7 (seven) days. Take for 12 weeks 12 capsule 0   No current facility-administered medications for this visit.    Allergies  Allergen Reactions  . Clindamycin/Lincomycin     CHEST PAIN  . Penicillins Hives  HIVES       Objective:  BP 118/80   Pulse 87   Temp 98.2 F (36.8 C) (Oral)   Wt 261 lb (118.4 kg)   LMP 03/28/2017 (Exact Date)   SpO2 96%   BMI 46.23 kg/m  Gen:  alert, ill-appearing, not toxic-appearing, no distress, appropriate for age HEENT: head normocephalic without obvious abnormality, conjunctiva and cornea clear, wearing glasses, right TM slightly retracted, there is b/l maxillary sinus tenderness, nasal mucosa edematous, oropharynx clear, moist mucous membranes, neck supple, no cervical adenopathy, trachea midline Pulm: Normal work of breathing, normal phonation, clear to auscultation bilaterally, no wheezes, rales or rhonchi CV: Normal rate, regular rhythm, s1 and s2 distinct, no murmurs, clicks or rubs  Neuro: alert and oriented x 3, no tremor MSK: extremities atraumatic, normal gait and station Skin:  intact, no rashes on exposed skin, no jaundice, no cyanosis   No results found for this or any previous visit (from the past 72 hour(s)). No results found.    Assessment and Plan: 42 y.o. female with   1. Acute bacterial sinusitis - symptoms > 3 weeks, second sickening, will cover for bacterial sinusitis with Cephalosporin (PCN allergy - rash). States she has tolerated Cephalosporins in the past. - cefUROXime (CEFTIN) 250 MG tablet; Take 1 tablet (250 mg total) by mouth 2 (two) times daily with a meal for 10 days.  Dispense: 20 tablet; Refill: 0  Patient education and anticipatory guidance given Patient agrees with treatment plan Follow-up as needed if symptoms worsen or fail to improve  Levonne Hubert PA-C

## 2017-06-07 ENCOUNTER — Other Ambulatory Visit: Payer: Self-pay

## 2017-06-07 DIAGNOSIS — F5081 Binge eating disorder: Secondary | ICD-10-CM

## 2017-06-07 MED ORDER — LISDEXAMFETAMINE DIMESYLATE 50 MG PO CAPS: 50.0000 mg | ORAL_CAPSULE | Freq: Every day | ORAL | 0 refills | Status: AC

## 2017-06-07 NOTE — Telephone Encounter (Signed)
Gabriella Williams called and requested a refill on Vyvanse to be sent to [facility]. She has a follow up on 06/16/17.

## 2017-06-13 ENCOUNTER — Ambulatory Visit: Payer: BLUE CROSS/BLUE SHIELD | Admitting: Osteopathic Medicine

## 2017-06-15 ENCOUNTER — Encounter: Payer: Self-pay | Admitting: Emergency Medicine

## 2017-06-15 ENCOUNTER — Emergency Department (INDEPENDENT_AMBULATORY_CARE_PROVIDER_SITE_OTHER)
Admission: EM | Admit: 2017-06-15 | Discharge: 2017-06-15 | Disposition: A | Discharge status: Home / Self Care | Payer: BLUE CROSS/BLUE SHIELD | Source: Home / Self Care | Attending: Emergency Medicine | Admitting: Emergency Medicine

## 2017-06-15 ENCOUNTER — Ambulatory Visit: Payer: BLUE CROSS/BLUE SHIELD | Admitting: Osteopathic Medicine

## 2017-06-15 ENCOUNTER — Other Ambulatory Visit: Payer: Self-pay

## 2017-06-15 DIAGNOSIS — G43A0 Cyclical vomiting, not intractable: Secondary | ICD-10-CM | POA: Diagnosis not present

## 2017-06-15 DIAGNOSIS — Z0189 Encounter for other specified special examinations: Secondary | ICD-10-CM

## 2017-06-15 DIAGNOSIS — R1010 Upper abdominal pain, unspecified: Secondary | ICD-10-CM | POA: Diagnosis not present

## 2017-06-15 DIAGNOSIS — R1011 Right upper quadrant pain: Secondary | ICD-10-CM

## 2017-06-15 LAB — POCT URINALYSIS DIP (MANUAL ENTRY)
Bilirubin, UA: NEGATIVE
Blood, UA: NEGATIVE
GLUCOSE UA: NEGATIVE mg/dL
Ketones, POC UA: NEGATIVE mg/dL
LEUKOCYTES UA: NEGATIVE
NITRITE UA: NEGATIVE
PROTEIN UA: NEGATIVE mg/dL
Spec Grav, UA: >=1.03 — AB (ref 1.010–1.025)
UROBILINOGEN UA: 0.2 U/dL
pH, UA: 6 (ref 5.0–8.0)

## 2017-06-15 LAB — POCT CBC W AUTO DIFF (K'VILLE URGENT CARE)

## 2017-06-15 LAB — POCT URINE PREGNANCY: PREG TEST UR: NEGATIVE

## 2017-06-15 MED ORDER — ONDANSETRON 8 MG PO TBDP: ORAL_TABLET | ORAL | 0 refills | Status: AC

## 2017-06-15 NOTE — ED Notes (Signed)
Password for labs: [phone]

## 2017-06-15 NOTE — Discharge Instructions (Addendum)
Please increase your fluid intake. You are scheduled for an ultrasound tomorrow. I have sent a prescription for nausea to the pharmacy. If you have worsening abdominal pain or develop significant fever please go to the emergency room. Patient advised not to take her meloxicam

## 2017-06-15 NOTE — ED Provider Notes (Addendum)
[facility]    CSN: [id number] Arrival date & time: 06/15/17  1455     History   Chief Complaint Chief Complaint  Patient presents with  . Abdominal Pain  . Headache    HPI Damian LeavellChristy Anne Ballengee is a 42 y.o. female.  Patient began feeling ill yesterday with nausea.  She has had an episode of vomiting.  She denies diarrhea or constipation but has been told she has had irritable bowel in the past.  She also has a headache with this.  She has generalized abdominal discomfort.  She had sweats yesterday but no documented fever.  Last menstrual period was about 10 days ago.  Her periods are regular.  History is complicated in that she was separated from her husband 6 months ago and they resumed their relationship 1 month ago.  He was sexually active with another partner and she was sexually active with another partner and she has concerns about a possible STD.  She states the man she was sexually active with  may have hepatitis she is currently taking meloxicam. HPI  Past Medical History:  Diagnosis Date  . BPPV (benign paroxysmal positional vertigo), left 09/13/2016    Patient Active Problem List   Diagnosis Date Noted  . Vaginal discharge 01/24/2017  . Pain medication agreement completed 11/18/2016  . BPPV (benign paroxysmal positional vertigo), left 09/13/2016  . 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 Hypothyroidism 07/13/2016  . History of abnormal cervical Pap smear 07/13/2016  . Routine screening for STI (sexually transmitted infection) 07/13/2016  . Chronic bilateral low back pain without sciatica 07/13/2016  . Plantar fasciitis, bilateral 07/13/2016  . History of vitamin D deficiency 07/13/2016  . History of non anemic vitamin B12 deficiency 07/13/2016    Past Surgical History:  Procedure Laterality Date  . CESAREAN SECTION    . TONSILLECTOMY    . TUBAL LIGATION      OB History   None      Home Medications    Prior to Admission medications   Medication Sig  Start Date End Date Taking? Authorizing Provider  albuterol (PROVENTIL HFA;VENTOLIN HFA) 108 (90 Base) MCG/ACT inhaler Inhale 2 puffs into the lungs every 4 (four) hours as needed for wheezing or shortness of breath. 04/11/17   Breeback, Lonna CobbJade L, PA-C  chlorhexidine (PERIDEX) 0.12 % solution Use as directed 15 mLs in the mouth or throat 2 (two) times daily. For 1-2 weeks 11/18/16   Sunnie NielsenAlexander, Natalie, DO  diclofenac sodium (VOLTAREN) 1 % GEL Apply 4 g topically 4 (four) times daily. As needed for pain, apply to affected joint/back. 07/12/16   Sunnie NielsenAlexander, Natalie, DO  Ergocalciferol 2000 units CAPS Take 1 capsule by mouth daily. 04/13/17   Carlis Stableummings, Charley Elizabeth, PA-C  escitalopram (LEXAPRO) 20 MG tablet Take 20 mg by mouth daily.    [provider]  fluticasone (FLONASE) 50 MCG/ACT nasal spray Place 2 sprays into both nostrils daily. 03/31/17   Lurene ShadowPhelps, Erin O, PA-C  HYDROcodone-homatropine (HYCODAN) 5-1.5 MG/5ML syrup Take 5 mLs by mouth every 12 (twelve) hours as needed. 04/04/17   Breeback, Lonna CobbJade L, PA-C  levothyroxine (SYNTHROID, LEVOTHROID) 50 MCG tablet Take 50 mcg by mouth daily before breakfast.    [provider]  lisdexamfetamine (VYVANSE) 50 MG capsule Take 1 capsule (50 mg total) by mouth daily. 06/07/17 07/07/17  Sunnie NielsenAlexander, Natalie, DO  meclizine (ANTIVERT) 25 MG tablet Take 1 tablet (25 mg total) by mouth 3 (three) times daily as needed for dizziness. 09/13/16  Sunnie Nielsen, DO  meloxicam (MOBIC) 7.5 MG tablet TAKE 1 TO 2 TABLETS BY MOUTH ONCE DAILY AS NEEDED FOR PAIN 02/07/17   Sunnie Nielsen, DO  ondansetron (ZOFRAN-ODT) 8 MG disintegrating tablet 1 tablet every 6-8 hours as needed for nausea 06/15/17   Collene Gobble, MD    Family History Family History  Problem Relation Age of Onset  . Diabetes Mother   . Hyperlipidemia Mother   . Cancer Mother   . Breast cancer Mother   . Diabetes Father   . Cancer Father     Social History Social History   Tobacco  Use  . Smoking status: Never Smoker  . Smokeless tobacco: Never Used  Substance Use Topics  . Alcohol use: Not on file  . Drug use: Not on file     Allergies   Clindamycin/lincomycin and Penicillins   Review of Systems Review of Systems  Constitutional: Negative for chills and fever.       Patient did have sweats but no documented fever  HENT: Negative.   Eyes: Negative.   Respiratory: Negative.   Cardiovascular: Negative.   Gastrointestinal: Positive for abdominal pain, nausea and vomiting.  Endocrine: Negative.   Genitourinary: Positive for vaginal discharge.  Neurological: Negative.   Psychiatric/Behavioral: Negative.      Physical Exam Triage Vital Signs ED Triage Vitals  Enc Vitals Group     BP 06/15/17 1526 136/84     Pulse Rate 06/15/17 1526 89     Resp --      Temp 06/15/17 1526 98.3 F (36.8 C)     Temp Source 06/15/17 1526 Oral     SpO2 06/15/17 1526 96 %     Weight 06/15/17 1529 264 lb (119.7 kg)     Height 06/15/17 1529 5\' 3"  (1.6 m)     Head Circumference --      Peak Flow --      Pain Score 06/15/17 1527 3     Pain Loc --      Pain Edu? --      Excl. in GC? --    No data found.  Updated Vital Signs BP 136/84 (BP Location: Right Arm)   Pulse 89   Temp 98.3 F (36.8 C) (Oral)   Ht 5\' 3"  (1.6 m)   Wt 264 lb (119.7 kg)   LMP 05/29/2017   SpO2 96%   BMI 46.77 kg/m   Visual Acuity Right Eye Distance:   Left Eye Distance:   Bilateral Distance:    Right Eye Near:   Left Eye Near:    Bilateral Near:     Physical Exam  Constitutional:  Overweight female who is in no distress.  HENT:  Head: Normocephalic.  Eyes: Pupils are equal, round, and reactive to light.  Cardiovascular: Normal rate and normal heart sounds.  Abdominal: Soft. Normal appearance and bowel sounds are normal. There is no hepatosplenomegaly. There is no tenderness. There is no CVA tenderness and negative Murphy's sign.     UC Treatments / Results  Labs (all labs  ordered are listed, but only abnormal results are displayed) Labs Reviewed  POCT URINALYSIS DIP (MANUAL ENTRY) - Abnormal; Notable for the following components:      Result Value   Spec Grav, UA >=1.030 (*)    All other components within normal limits  WET PREP BY MOLECULAR PROBE  C. TRACHOMATIS/N. GONORRHOEAE RNA  COMPLETE METABOLIC PANEL WITH GFR  AMYLASE  LIPASE  HIV ANTIBODY (ROUTINE TESTING)  RPR  HEPATITIS PANEL, ACUTE  HSV(HERPES SIMPLEX VRS) I + II AB-IGG  POCT URINE PREGNANCY  POCT CBC W AUTO DIFF ([facility])    EKG None Radiology No results found.  Procedures Procedures (including critical care time)  Medications Ordered in UC Medications - No data to display   Initial Impression / Assessment and Plan / UC Course  I have reviewed the triage vital signs and the nursing notes.  Pertinent labs & imaging results that were available during my care of the patient were reviewed by me and considered in my medical decision making (see chart for details). Complicated history.  Patient is concerned about STD exposure.  She has upper abdominal pain and nausea need to rule out gallbladder disease.  Appropriate labs were done.  She will increase her fluid intake since her specific gravity was 1.030.  Pregnancy test done was negative.  She has been on meloxicam which certainly could be causing some of her nausea and abdominal discomfort.      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 You are scheduled for an ultrasound tomorrow. I have sent a prescription for nausea to the pharmacy. If you have worsening abdominal pain or develop significant fever  please go to the emergency room. Patient advised not to take her meloxicam patient also advised to watch for vomiting of blood or dark-colored stools.. Controlled Substance Prescriptions [place] Controlled Substance Registry consulted? Not Applicable   Collene Gobble, MD 06/15/17 1636    Collene Gobble, MD 06/15/17 1657    Collene Gobble, MD 06/15/17 [phone]

## 2017-06-15 NOTE — ED Triage Notes (Signed)
Pt has been having some vaginal issues, and is concerned about possible STD exposure.

## 2017-06-16 ENCOUNTER — Ambulatory Visit (INDEPENDENT_AMBULATORY_CARE_PROVIDER_SITE_OTHER): Payer: BLUE CROSS/BLUE SHIELD

## 2017-06-16 ENCOUNTER — Telehealth: Payer: Self-pay

## 2017-06-16 DIAGNOSIS — R1011 Right upper quadrant pain: Secondary | ICD-10-CM

## 2017-06-16 DIAGNOSIS — K760 Fatty (change of) liver, not elsewhere classified: Secondary | ICD-10-CM

## 2017-06-16 LAB — LIPASE: LIPASE: 13 U/L (ref 7–60)

## 2017-06-16 LAB — RPR: RPR Ser Ql: NONREACTIVE

## 2017-06-16 LAB — COMPLETE METABOLIC PANEL WITH GFR
AG Ratio: 1.8 (calc) (ref 1.0–2.5)
ALBUMIN MSPROF: 4.4 g/dL (ref 3.6–5.1)
ALT: 16 U/L (ref 6–29)
AST: 19 U/L (ref 10–30)
Alkaline phosphatase (APISO): 75 U/L (ref 33–115)
BUN: 13 mg/dL (ref 7–25)
CO2: 28 mmol/L (ref 20–32)
Calcium: 9.3 mg/dL (ref 8.6–10.2)
Chloride: 102 mmol/L (ref 98–110)
Creat: 0.6 mg/dL (ref 0.50–1.10)
GFR, Est African American: 129 mL/min/{1.73_m2} (ref >=60)
GFR, Est Non African American: 112 mL/min/{1.73_m2} (ref >=60)
GLOBULIN: 2.5 g/dL (ref 1.9–3.7)
Glucose, Bld: 94 mg/dL (ref 65–99)
Potassium: 3.9 mmol/L (ref 3.5–5.3)
SODIUM: 140 mmol/L (ref 135–146)
Total Bilirubin: 0.5 mg/dL (ref 0.2–1.2)
Total Protein: 6.9 g/dL (ref 6.1–8.1)

## 2017-06-16 LAB — C. TRACHOMATIS/N. GONORRHOEAE RNA
C. TRACHOMATIS RNA, TMA: NOT DETECTED
N. gonorrhoeae RNA, TMA: NOT DETECTED

## 2017-06-16 LAB — HEPATITIS PANEL, ACUTE
HEP A IGM: NONREACTIVE
HEP B S AG: NONREACTIVE
Hep B C IgM: NONREACTIVE
Hepatitis C Ab: NONREACTIVE
SIGNAL TO CUT-OFF: 0.01 (ref <1.00)

## 2017-06-16 LAB — HSV(HERPES SIMPLEX VRS) I + II AB-IGG
HAV 1 IGG,TYPE SPECIFIC AB: 49 index — ABNORMAL HIGH
HSV 2 IGG,TYPE SPECIFIC AB: <0.9 index

## 2017-06-16 LAB — WET PREP BY MOLECULAR PROBE
Candida species: NOT DETECTED
MICRO NUMBER:: 90507625
SPECIMEN QUALITY: ADEQUATE
Trichomonas vaginosis: NOT DETECTED

## 2017-06-16 LAB — HIV ANTIBODY (ROUTINE TESTING W REFLEX): HIV 1&2 Ab, 4th Generation: NONREACTIVE

## 2017-06-16 LAB — AMYLASE: Amylase: 22 U/L (ref 21–101)

## 2017-06-16 NOTE — Telephone Encounter (Signed)
Left msg for pt to call back

## 2017-06-16 NOTE — Telephone Encounter (Signed)
Spoke to Gabriella Williams regarding lab results. 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 Per Dr Cleta Albertsaub - metrogel was called in. Advised Gabriella Williams to apply BID for 5 days. Also recommended OTC Zantac 150mg  BID if nausea continues. Has appt with Dr Lyn HollingsheadAlexander. Advised Gabriella Williams to come in tomorrow if she is not feeling somewhat better per Dr Cleta Albertsaub.

## 2017-06-19 ENCOUNTER — Ambulatory Visit: Payer: BLUE CROSS/BLUE SHIELD | Admitting: Osteopathic Medicine

## 2017-06-19 ENCOUNTER — Other Ambulatory Visit: Payer: Self-pay | Admitting: Osteopathic Medicine

## 2017-06-19 DIAGNOSIS — M545 Low back pain, unspecified: Secondary | ICD-10-CM

## 2017-06-19 DIAGNOSIS — G8929 Other chronic pain: Secondary | ICD-10-CM

## 2017-06-20 NOTE — Telephone Encounter (Signed)
I sent limited refill of medications but this patient has no showed to multiple visits and has been discharged from the practice for the second and final time.  Certified letter was placed in [facility] basket yesterday.

## 2017-06-20 NOTE — Telephone Encounter (Signed)
Noted  

## 2017-06-20 NOTE — Telephone Encounter (Signed)
[facility] pharmacy requesting med refills for tramadol. Thanks.

## 2017-06-21 ENCOUNTER — Telehealth: Payer: Self-pay | Admitting: Emergency Medicine

## 2017-06-21 NOTE — Telephone Encounter (Signed)
Patient states she is feeling much better. Knows to follow with Dr.Alexander.

## 2017-07-12 ENCOUNTER — Telehealth: Payer: Self-pay | Admitting: Osteopathic Medicine

## 2017-07-12 ENCOUNTER — Other Ambulatory Visit: Payer: Self-pay | Admitting: Osteopathic Medicine

## 2017-07-12 DIAGNOSIS — G8929 Other chronic pain: Secondary | ICD-10-CM

## 2017-07-12 DIAGNOSIS — M545 Low back pain: Principal | ICD-10-CM

## 2017-07-12 NOTE — Telephone Encounter (Signed)
Forwarding to provider for review.

## 2017-07-13 NOTE — Telephone Encounter (Signed)
Left pt a detailed vm msg re: denial of med RF & provider's note. Call back information provided.

## 2017-07-13 NOTE — Telephone Encounter (Signed)
She was dismissed from practice due to multiple no-shows to visits. Will not give further refills of controlled substances since she failed to follow-up in the office as scheduled (was due to see me for this in January...)   See letter or contact Toniann Fail for details re: dismissal

## 2017-07-13 NOTE — Telephone Encounter (Signed)
Noted  

## 2017-09-15 ENCOUNTER — Other Ambulatory Visit: Payer: Self-pay | Admitting: Osteopathic Medicine

## 2017-09-15 DIAGNOSIS — M545 Low back pain: Principal | ICD-10-CM

## 2017-09-15 DIAGNOSIS — G8929 Other chronic pain: Secondary | ICD-10-CM

## 2017-09-15 DIAGNOSIS — M722 Plantar fascial fibromatosis: Secondary | ICD-10-CM

## 2019-08-23 IMAGING — US US ABDOMEN COMPLETE
1 series · 14 of 25 positions shown · non-contrast
Comparison: None.

CLINICAL DATA: Right upper quadrant pain with nausea, vomiting and
chills for 3-4 days.

EXAM:
ABDOMEN ULTRASOUND COMPLETE

[Series 1: us abdomen complete · 0.18mm/px · 14 of 76 slices shown]
[im 1/76]
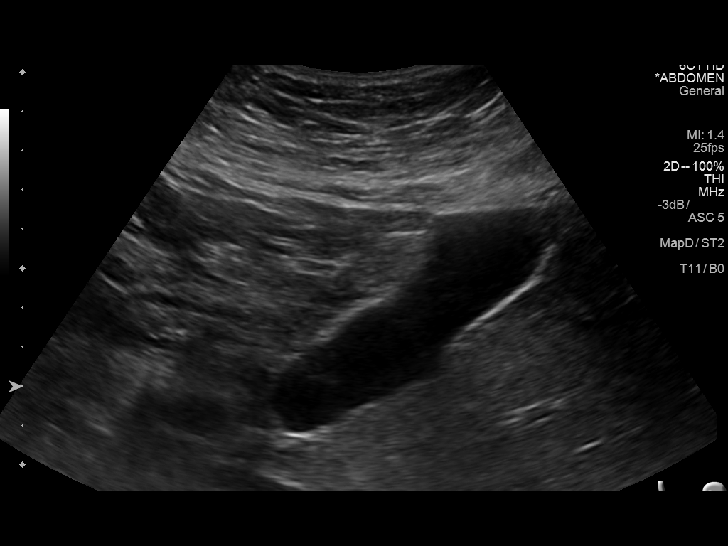
[im 7/76]
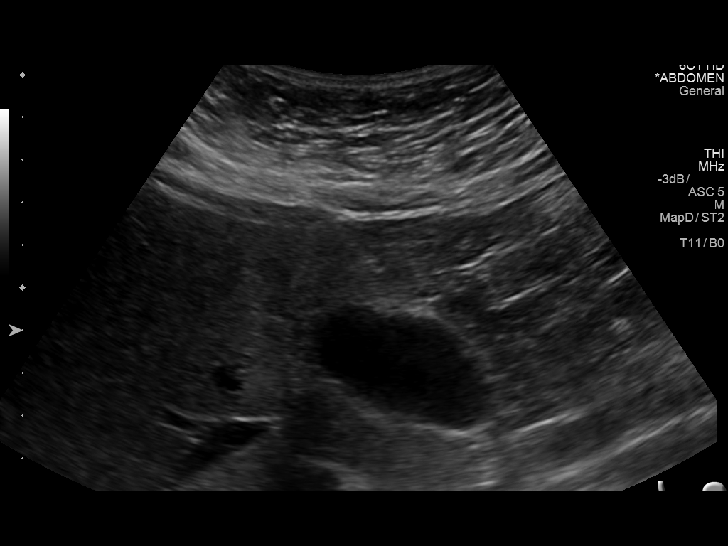
[im 13/76]
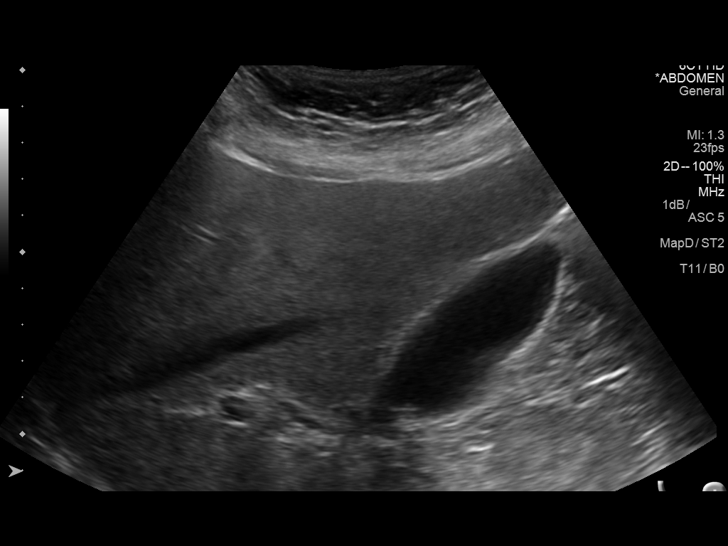
[im 19/76]
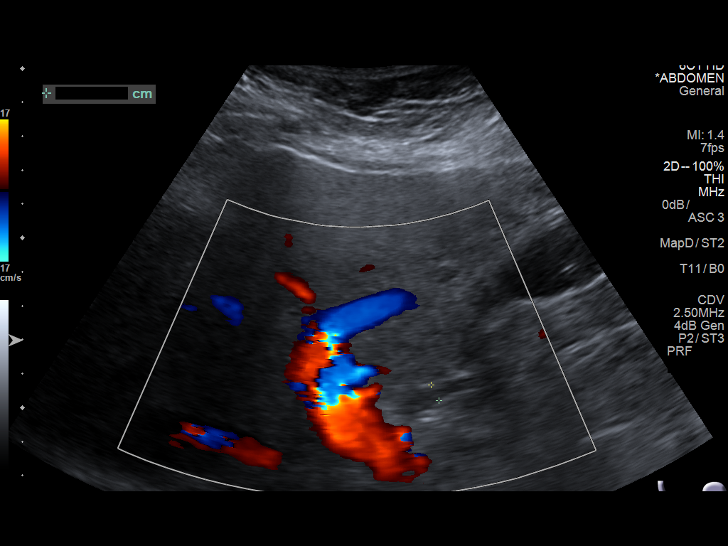
[im 26/76]
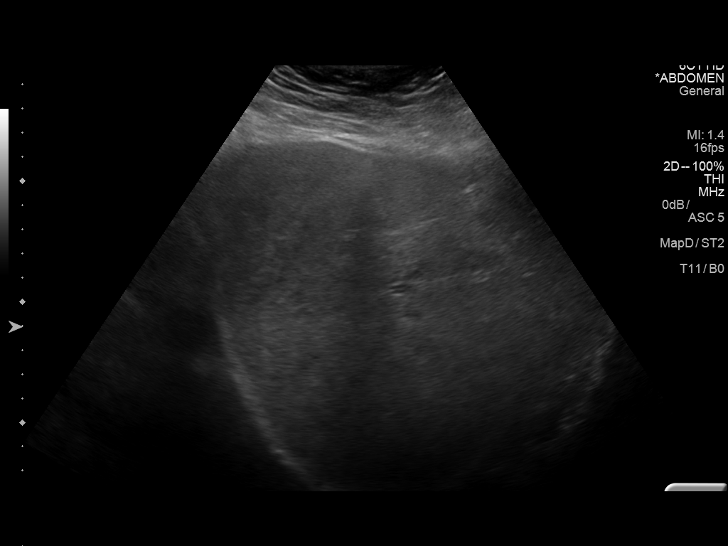
[im 29/76]
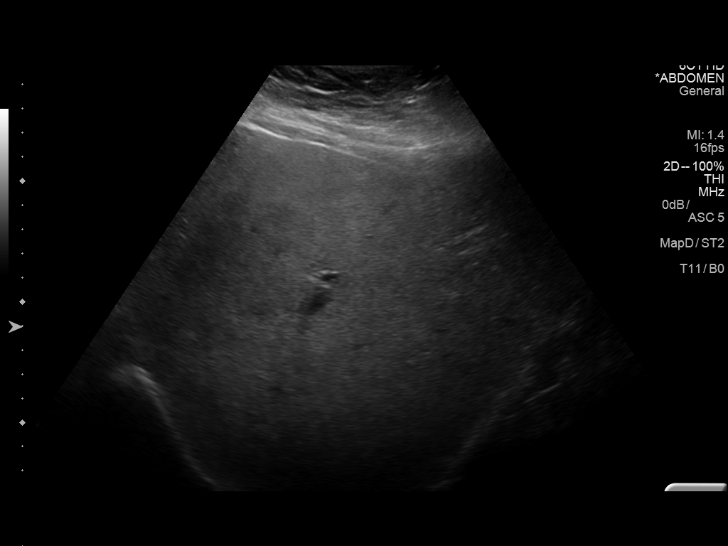
[im 35/76]
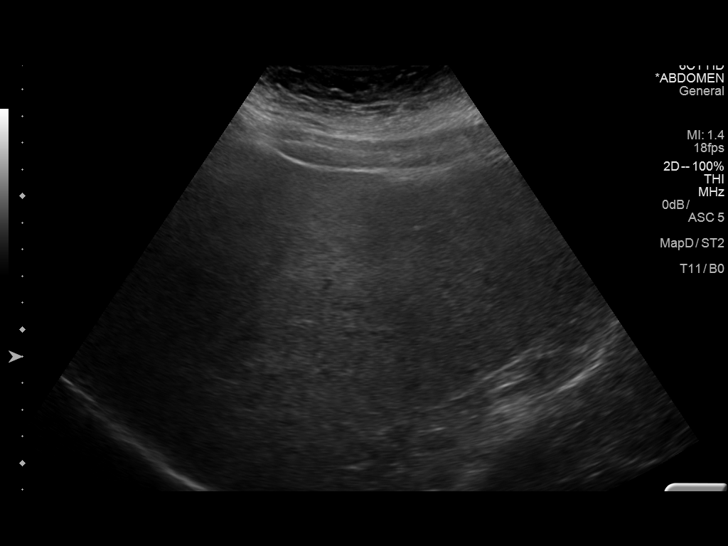
[im 41/76]
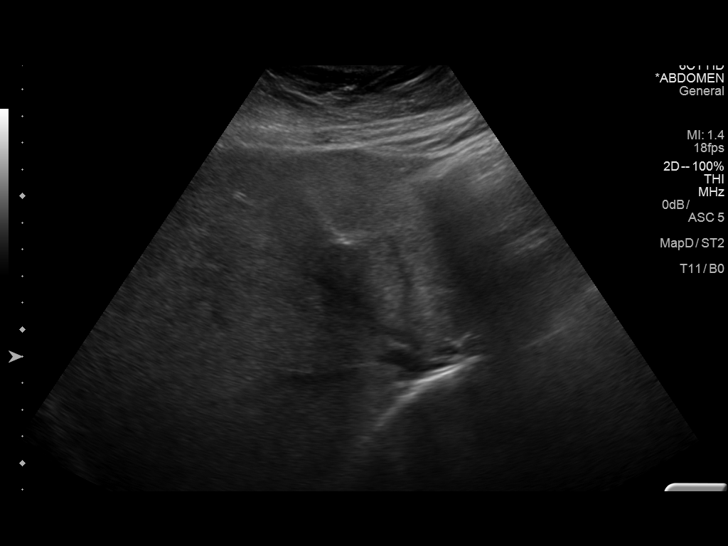
[im 47/76]
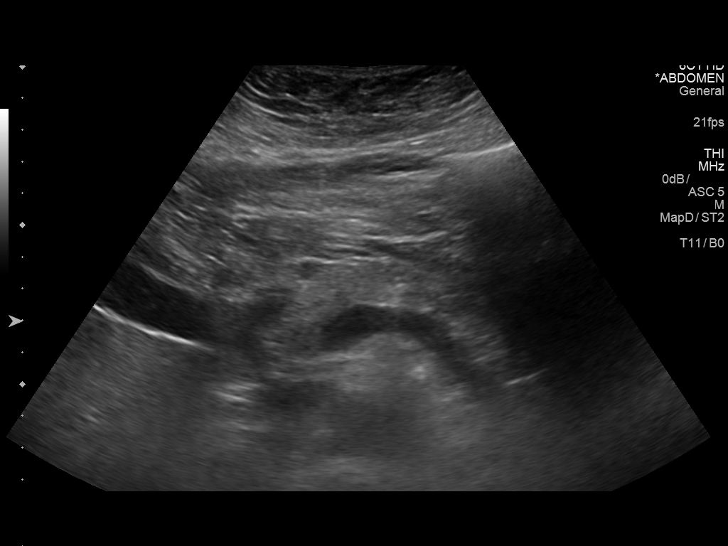
[im 51/76]
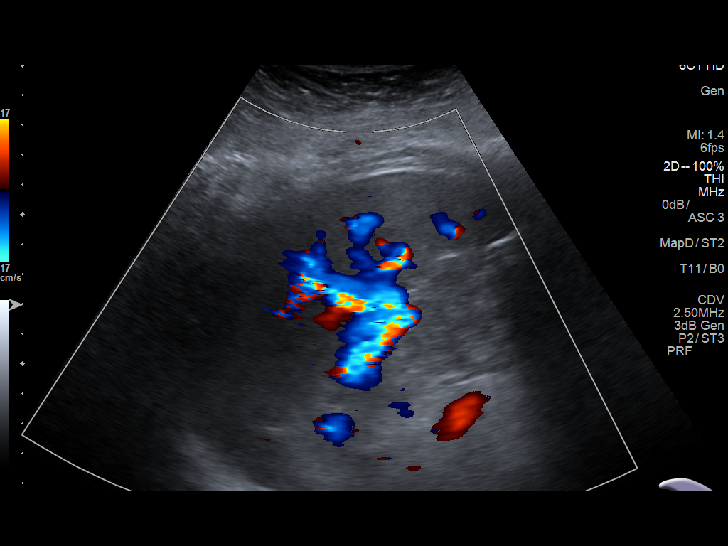
[im 57/76]
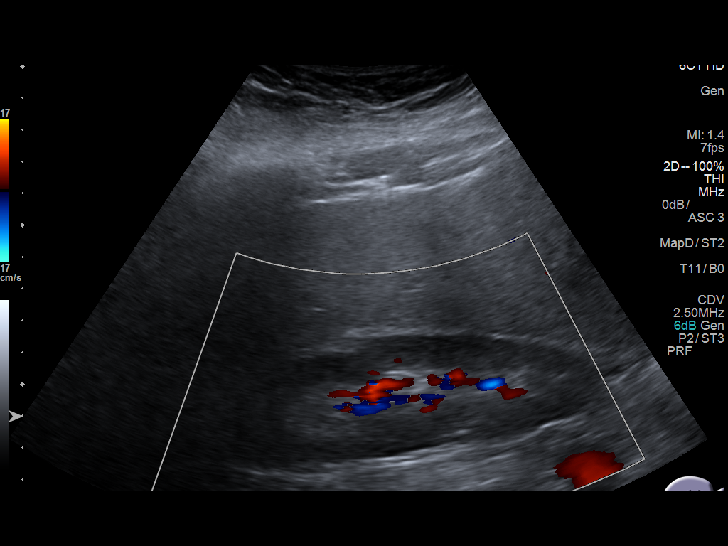
[im 63/76]
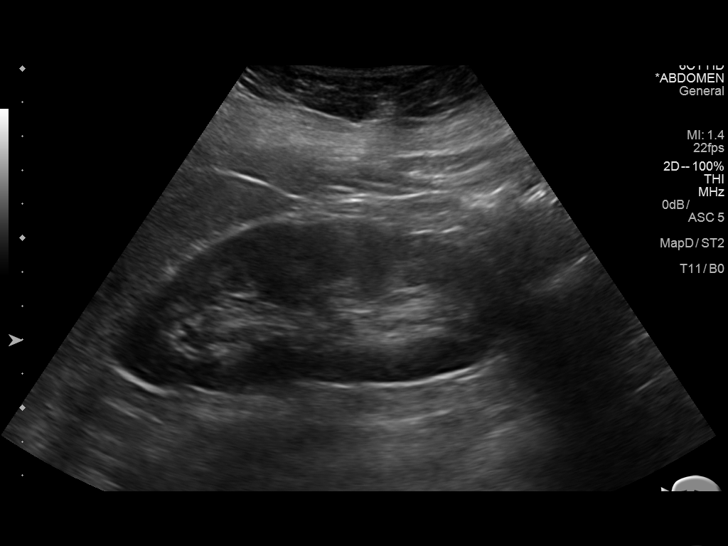
[im 69/76]
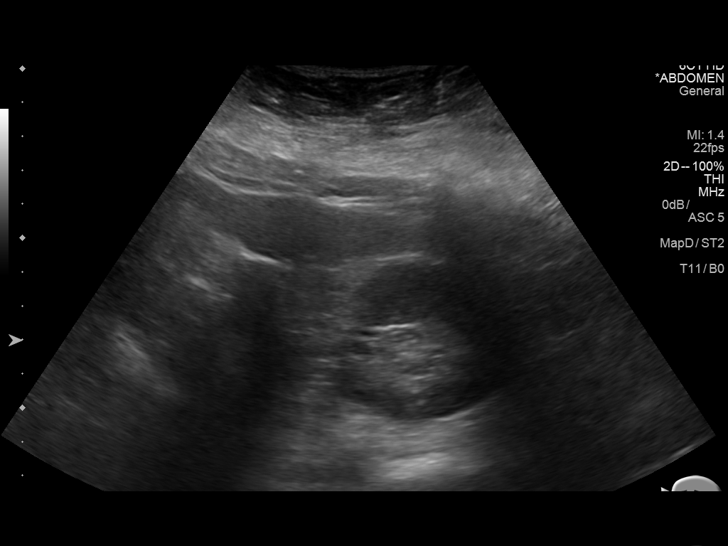
[im 76/76]
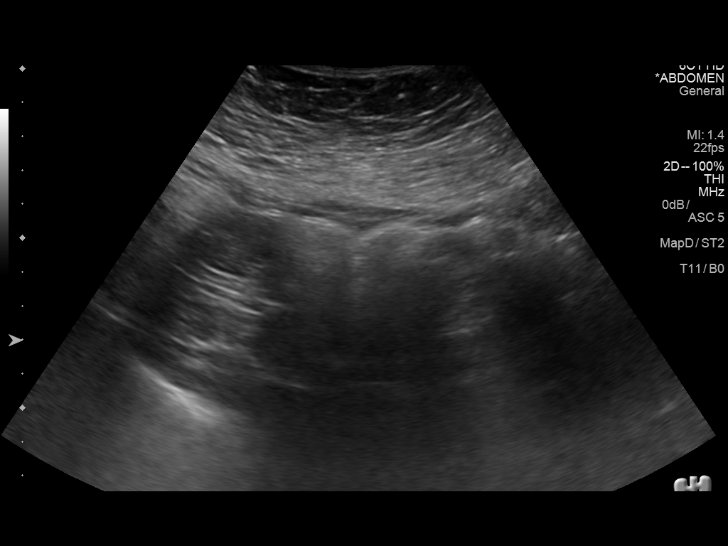

[14 of 25 positions shown; findings below may reference images not displayed]

FINDINGS: Gallbladder: No gallstones or wall thickening visualized. No
sonographic Murphy sign noted by sonographer.

Common bile duct: Diameter: 0.5 cm

Liver: No focal lesion is identified. Echogenicity is increased
consistent with fatty infiltration. Portal vein is patent on color
Doppler imaging with normal direction of blood flow towards the
liver.

IVC: No abnormality visualized.

Pancreas: Visualized portion unremarkable.

Spleen: Size and appearance within normal limits.

Right Kidney: Length: 11.7 cm. Echogenicity within normal limits. No
mass or hydronephrosis visualized.

Left Kidney: Length: 11.4 cm. Echogenicity within normal limits. No
mass or hydronephrosis visualized.

Abdominal aorta: No aneurysm visualized.

Other findings: None.
IMPRESSION: Negative for gallstones.  No acute abnormality.

Fatty infiltration of the liver.

## 2020-08-12 ENCOUNTER — Telehealth: Payer: Self-pay | Admitting: Medical-Surgical

## 2020-08-12 NOTE — Telephone Encounter (Signed)
Gabriella Williams called this afternoon, wanting to schedule an appt. She was dismissed back in 05/2017 by Dr.Alexander for no show's. She would like to know if one of you would take her on as a new patient. I let her know that if  this is a possibility, someone would call to schedule.

## 2020-08-17 NOTE — Telephone Encounter (Signed)
I called Gabriella Williams and left a voicemail to call and schedule a new patient appointment.

## 2020-11-05 ENCOUNTER — Ambulatory Visit: Payer: BLUE CROSS/BLUE SHIELD | Admitting: Medical-Surgical

## 2020-11-05 ENCOUNTER — Telehealth: Payer: Self-pay | Admitting: Medical-Surgical

## 2020-11-05 NOTE — Telephone Encounter (Signed)
I decline to take her.  Thanks!  CM

## 2020-11-05 NOTE — Telephone Encounter (Signed)
Patient discharged from this practice in 2019 for repeated no-shows and late cancellations.  A message was sent in June regarding being accepted at this practice again by either myself or Dr. Ashley Royalty.  She made an appointment for today and was aware of the attendance policy and the need to cancel beforehand.  She no showed her appointment today 11/05/2020.  Please do not reschedule this patient with me.  If Dr. Ashley Royalty would like to accept her, that is up to his discretion.  Thayer Ohm, DNP, APRN, FNP-BC [facility]

## 2020-11-06 NOTE — Telephone Encounter (Signed)
Noted in patient's chart.
